# Patient Record
Sex: Male | Born: 1980
Health system: Southern US, Community
[De-identification: ages and names within clinical notes are randomized; demographics above are authoritative.]

## PROBLEM LIST (undated history)

## (undated) ENCOUNTER — Emergency Department (HOSPITAL_COMMUNITY): Payer: BC Managed Care – PPO

## (undated) HISTORY — PX: WISDOM TOOTH EXTRACTION: SHX21

## (undated) HISTORY — PX: BREAST BIOPSY: SHX20

---

## 2014-03-16 ENCOUNTER — Ambulatory Visit: Payer: Self-pay | Admitting: Family Medicine

## 2014-07-19 DIAGNOSIS — D239 Other benign neoplasm of skin, unspecified: Secondary | ICD-10-CM

## 2014-07-19 HISTORY — DX: Other benign neoplasm of skin, unspecified: D23.9

## 2014-11-14 ENCOUNTER — Encounter: Payer: Self-pay | Admitting: Internal Medicine

## 2014-11-14 ENCOUNTER — Ambulatory Visit (INDEPENDENT_AMBULATORY_CARE_PROVIDER_SITE_OTHER): Payer: BLUE CROSS/BLUE SHIELD | Admitting: Internal Medicine

## 2014-11-14 VITALS — BP 124/72 | HR 57 | Temp 98.3°F | Ht 71.5 in | Wt 185.5 lb

## 2014-11-14 DIAGNOSIS — Z0000 Encounter for general adult medical examination without abnormal findings: Secondary | ICD-10-CM | POA: Insufficient documentation

## 2014-11-14 DIAGNOSIS — R5383 Other fatigue: Secondary | ICD-10-CM

## 2014-11-14 LAB — CBC WITH DIFFERENTIAL/PLATELET
Basophils Absolute: 0 10*3/uL (ref 0.0–0.1)
Basophils Relative: 0.6 % (ref 0.0–3.0)
EOS ABS: 0.1 10*3/uL (ref 0.0–0.7)
Eosinophils Relative: 1.7 % (ref 0.0–5.0)
HCT: 41.5 % (ref 39.0–52.0)
HEMOGLOBIN: 14.3 g/dL (ref 13.0–17.0)
Lymphocytes Relative: 44.7 % (ref 12.0–46.0)
Lymphs Abs: 2.3 10*3/uL (ref 0.7–4.0)
MCHC: 34.6 g/dL (ref 30.0–36.0)
MCV: 83.3 fl (ref 78.0–100.0)
MONO ABS: 0.4 10*3/uL (ref 0.1–1.0)
Monocytes Relative: 8.1 % (ref 3.0–12.0)
NEUTROS ABS: 2.3 10*3/uL (ref 1.4–7.7)
Neutrophils Relative %: 44.9 % (ref 43.0–77.0)
Platelets: 249 10*3/uL (ref 150.0–400.0)
RBC: 4.98 Mil/uL (ref 4.22–5.81)
RDW: 12.4 % (ref 11.5–15.5)
WBC: 5.1 10*3/uL (ref 4.0–10.5)

## 2014-11-14 LAB — T4, FREE: Free T4: 0.82 ng/dL (ref 0.60–1.60)

## 2014-11-14 LAB — COMPREHENSIVE METABOLIC PANEL
ALT: 13 U/L (ref 0–53)
AST: 19 U/L (ref 0–37)
Albumin: 4.4 g/dL (ref 3.5–5.2)
Alkaline Phosphatase: 70 U/L (ref 39–117)
BUN: 18 mg/dL (ref 6–23)
CALCIUM: 9.5 mg/dL (ref 8.4–10.5)
CHLORIDE: 103 meq/L (ref 96–112)
CO2: 29 mEq/L (ref 19–32)
Creatinine, Ser: 1.04 mg/dL (ref 0.40–1.50)
GFR: 87.26 mL/min (ref >60.00)
Glucose, Bld: 84 mg/dL (ref 70–99)
Potassium: 4.3 mEq/L (ref 3.5–5.1)
Sodium: 138 mEq/L (ref 135–145)
Total Bilirubin: 0.5 mg/dL (ref 0.2–1.2)
Total Protein: 7.9 g/dL (ref 6.0–8.3)

## 2014-11-14 LAB — LIPID PANEL
Cholesterol: 244 mg/dL — ABNORMAL HIGH (ref 0–200)
HDL: 48.6 mg/dL (ref >39.00)
LDL Cholesterol: 171 mg/dL — ABNORMAL HIGH (ref 0–99)
NonHDL: 195.4
Total CHOL/HDL Ratio: 5
Triglycerides: 121 mg/dL (ref 0.0–149.0)
VLDL: 24.2 mg/dL (ref 0.0–40.0)

## 2014-11-14 NOTE — Progress Notes (Signed)
Pre visit review using our clinic review tool, if applicable. No additional management support is needed unless otherwise documented below in the visit note. 

## 2014-11-14 NOTE — Assessment & Plan Note (Signed)
Healthy No action needed reassured

## 2014-11-14 NOTE — Assessment & Plan Note (Signed)
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

## 2014-11-14 NOTE — Progress Notes (Signed)
Subjective:    Patient ID: Ricky Barajas, male    DOB: [birth year], 34 y.o.   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 Feels tired at times. Busy with kids. Has 16 acres  No current outpatient prescriptions on file prior to visit.   No current facility-administered medications on file prior to visit.    No Known Allergies  History reviewed. No pertinent past medical history.  Past Surgical History  Procedure Laterality Date  . Wisdom tooth extraction    . Wisdom tooth extraction      Family History  Problem Relation Age of Onset  . Arthritis Father   . Hyperlipidemia Father   . Heart disease Father   . Diabetes Father   . Mental illness Sister   . Cancer Maternal Grandfather     leukemia  . Hypothyroidism Mother     History   Social History  . Marital Status: Married    Spouse Name: N/A  . Number of Children: 3  . 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 Smoking status: Never Smoker   . Smokeless tobacco: Never Used  . Alcohol Use: No  . Drug Use: No  . Sexual Activity: Yes   Other Topics Concern  . Not on file   Social History Narrative  . No narrative on file   Review of Systems  Constitutional: Positive for fatigue. Negative for unexpected weight change.       Exercises regularly Wears seat belt  HENT: Negative for dental problem, hearing loss and tinnitus.        Regular with dentist  Eyes: Negative for visual disturbance.       No diplopia or unilateral vision loss  Respiratory: Negative for cough, chest tightness and shortness of breath.        Exercise induced asthma in past---better lately    Cardiovascular: Positive for leg swelling. Negative for chest pain and palpitations.       Some edema when hiking in [place] recently  Gastrointestinal: Negative for nausea, vomiting, abdominal pain, constipation and blood in stool.       No heartburn  Endocrine: Negative for polydipsia and polyuria.  Genitourinary: Negative for dysuria, urgency, frequency and difficulty urinating.  Musculoskeletal: Negative for back pain, joint swelling and arthralgias.  Skin: Negative for rash.       Some bites when in [place]--using antibiotic cream  Allergic/Immunologic: Negative for environmental allergies and immunocompromised state.  Neurological: Negative for dizziness, syncope, weakness, light-headedness, numbness and headaches.  Hematological: Negative for adenopathy. Does not bruise/bleed easily.  Psychiatric/Behavioral: Negative for sleep disturbance and dysphoric mood. The patient is not nervous/anxious.        Objective:   Physical Exam  Constitutional: He is oriented to person, place, and time. He appears well-developed and well-nourished. No distress.  HENT:  Head: Normocephalic and atraumatic.  Right Ear: External ear normal.  Left Ear: External ear normal.  Mouth/Throat: Oropharynx is clear and moist. No oropharyngeal exudate.  Eyes: Conjunctivae and EOM are normal. Pupils are equal, round, and reactive to light.  Neck: Normal range of motion. Neck supple. No thyromegaly present.  Cardiovascular: Normal rate, regular rhythm, normal heart sounds  and intact distal pulses.  Exam reveals no gallop.   No murmur heard. Pulmonary/Chest: Effort normal and breath sounds normal. No respiratory distress. He has no wheezes. He has no rales.  Abdominal: Soft. There is no tenderness.  Musculoskeletal: He exhibits no edema or tenderness.  Lymphadenopathy:    He has no cervical adenopathy.  Neurological: He is alert and oriented to person, place, and time.  Skin: No rash noted. No erythema.   Psychiatric: He has a normal mood and affect. His behavior is normal.          Assessment & Plan:

## 2014-11-15 ENCOUNTER — Encounter: Payer: Self-pay | Admitting: *Deleted

## 2015-09-18 ENCOUNTER — Ambulatory Visit (INDEPENDENT_AMBULATORY_CARE_PROVIDER_SITE_OTHER)
Admission: RE | Admit: 2015-09-18 | Discharge: 2015-09-18 | Disposition: A | Discharge status: Home / Self Care | Payer: BLUE CROSS/BLUE SHIELD | Source: Ambulatory Visit | Attending: Internal Medicine | Admitting: Internal Medicine

## 2015-09-18 ENCOUNTER — Encounter: Payer: Self-pay | Admitting: Primary Care

## 2015-09-18 ENCOUNTER — Ambulatory Visit (INDEPENDENT_AMBULATORY_CARE_PROVIDER_SITE_OTHER): Payer: BLUE CROSS/BLUE SHIELD | Admitting: Internal Medicine

## 2015-09-18 VITALS — BP 122/72 | HR 71 | Temp 98.4°F | Ht 71.5 in | Wt 184.4 lb

## 2015-09-18 DIAGNOSIS — M25571 Pain in right ankle and joints of right foot: Secondary | ICD-10-CM | POA: Diagnosis not present

## 2015-09-18 DIAGNOSIS — S93401A Sprain of unspecified ligament of right ankle, initial encounter: Secondary | ICD-10-CM

## 2015-09-18 DIAGNOSIS — S93402A Sprain of unspecified ligament of left ankle, initial encounter: Secondary | ICD-10-CM | POA: Diagnosis not present

## 2015-09-18 NOTE — Patient Instructions (Signed)

## 2015-09-18 NOTE — Progress Notes (Signed)
Pre visit review using our clinic review tool, if applicable. No additional management support is needed unless otherwise documented below in the visit note. 

## 2015-09-18 NOTE — Progress Notes (Signed)
Subjective:    Patient ID: Ricky Barajas, male    DOB: [birth year], 34 y.o.   MRN: [id number]  HPI  Pt presents to the clinic today with c/o bilateral ankle pain and swelling. He reports this started yesterday while he was playing basketball. He rolled his left ankle, but walked it off and kept playing. A little while later, he rolled his right ankle. He was not able to continue playing after that. He is able to walk but it is difficult. He has noticed more swelling and bruising of the right ankle more than the left. He has taken some Ibuprofen and put ice on it with some relief.  Review of Systems      History reviewed. No pertinent past medical history.  No current outpatient prescriptions on file.   No current facility-administered medications for this visit.    No Known Allergies  Family History  Problem Relation Age of Onset  . Arthritis Father   . Hyperlipidemia Father   . Heart disease Father   . Diabetes Father   . Mental illness Sister   . Cancer Maternal Grandfather     leukemia  . Hypothyroidism Mother     Social History   Social History  . Marital Status: Married    Spouse Name: N/A  . Number of Children: 3  . 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 Smoking status: Never Smoker   . Smokeless tobacco: Never Used  . Alcohol Use: No  . Drug Use: No  . Sexual Activity: Yes   Other Topics Concern  . Not on file   Social History Narrative     Constitutional: Denies fever, malaise, fatigue, headache or abrupt weight changes.  Musculoskeletal: Pt reports bilateral ankle pain and swelling. Denies muscle pain.  Skin: Pt reports bruising of right ankle. Denies redness, rashes, lesions or ulcercations.    No other specific complaints in a complete review of systems (except as listed in HPI above).  Objective:   Physical Exam  BP 122/72 mmHg  Pulse 71  Temp(Src)  98.4 F (36.9 C) (Oral)  Ht 5' 11.5" (1.816 m)  Wt 184 lb 6.4 oz (83.643 kg)  BMI 25.36 kg/m2  SpO2 97% Wt Readings from Last 3 Encounters:  09/18/15 184 lb 6.4 oz (83.643 kg)  11/14/14 185 lb 8 oz (84.142 kg)    General: Appears her stated age, well developed, well nourished in NAD. Skin: Warm, dry and intact. 4 cm round bruising noted of the lateral malleolus, right ankle. Heart: 2+ pedal pulses bilaterally. Cap refill < 3 secs. Musculoskeletal: Normal flexion, extension and rotation of the left ankle. Mild pain with palpation over the lateral malleolus, left ankle. Normal flexion and extension of the right ankle. Decreased rotation of the right ankle. Pain with palpation over the lateral malleolus, right ankle. Bilateral ankle swelling noted. Neurological: Alert and oriented. Sensation intact to BLE.   BMET    Component Value Date/Time   NA 138 11/14/2014 1229   K 4.3 11/14/2014 1229   CL 103 11/14/2014 1229   CO2 29 11/14/2014 1229   GLUCOSE 84 11/14/2014 1229   BUN 18 11/14/2014 1229   CREATININE 1.04 11/14/2014 1229   CALCIUM 9.5 11/14/2014 1229    Lipid Panel     Component Value Date/Time   CHOL 244* 11/14/2014 1229   TRIG 121.0 11/14/2014 1229   HDL 48.60 11/14/2014 1229  CHOLHDL 5 11/14/2014 1229   VLDL 24.2 11/14/2014 1229   LDLCALC 171* 11/14/2014 1229    CBC    Component Value Date/Time   WBC 5.1 11/14/2014 1229   RBC 4.98 11/14/2014 1229   HGB 14.3 11/14/2014 1229   HCT 41.5 11/14/2014 1229   PLT 249.0 11/14/2014 1229   MCV 83.3 11/14/2014 1229   MCHC 34.6 11/14/2014 1229   RDW 12.4 11/14/2014 1229   LYMPHSABS 2.3 11/14/2014 1229   MONOABS 0.4 11/14/2014 1229   EOSABS 0.1 11/14/2014 1229   BASOSABS 0.0 11/14/2014 1229    Hgb A1C No results found for: HGBA1C       Assessment & Plan:   Bilateral ankle pain, right > left:  Likely sprain He would like xray of right ankle- negative for fracture Right ankle wrapped with ACE  wrap Information given on [facility] He declines RX for Tramadol for pain  Return precautions given

## 2015-11-07 ENCOUNTER — Ambulatory Visit (INDEPENDENT_AMBULATORY_CARE_PROVIDER_SITE_OTHER): Payer: BLUE CROSS/BLUE SHIELD | Admitting: Family Medicine

## 2015-11-07 ENCOUNTER — Encounter: Payer: Self-pay | Admitting: Family Medicine

## 2015-11-07 VITALS — BP 108/64 | HR 64 | Temp 98.6°F | Wt 191.5 lb

## 2015-11-07 DIAGNOSIS — M79604 Pain in right leg: Secondary | ICD-10-CM | POA: Diagnosis not present

## 2015-11-07 NOTE — Patient Instructions (Signed)
Likely a hip adductor strain.  Gradual return to exercise.  Gently stretching in the meantime.  You can use an ace wrap for compression if needed.   Take care.  Glad to see you.

## 2015-11-07 NOTE — Progress Notes (Signed)
Pre visit review using our clinic review tool, if applicable. No additional management support is needed unless otherwise documented below in the visit note.  R ankle sprain in 08/2015.  Xray neg.  Slowly having sx resolve.  Still bracing with a lace up support, gradually returning to exercise in the last 2-3 weeks.    Now with medial R hamstring pain, less on the L side.  No thigh injury.  No redness.  No swelling. No bruising.  Pain comes and goes.  Noted in the last ~1 week, worse in the last few days, more with standing.  Not having pain when waking up in bed.  No h/o DVT, no known FH DVT.  No CP, not SOB.   Meds, vitals, and allergies reviewed.   ROS: See HPI.  Otherwise, noncontributory.  nad ncat rrr ctab R hamstring and quad not ttp, no bruising, no rash. Slightly ttp on medial thigh, just superior to the knee, along the distal adductors.  Calf 38.5cm B No bands, no chords.

## 2015-11-08 ENCOUNTER — Ambulatory Visit: Payer: BLUE CROSS/BLUE SHIELD | Admitting: Family Medicine

## 2015-11-08 DIAGNOSIS — M79606 Pain in leg, unspecified: Secondary | ICD-10-CM | POA: Insufficient documentation

## 2015-11-08 NOTE — Assessment & Plan Note (Signed)
Likely adductor strain, d/w pt about stretching, ACE wrap and f/u prn.  Gradual return to exercise.  No sign of ominous dx, d/w pt.

## 2016-05-11 ENCOUNTER — Ambulatory Visit (INDEPENDENT_AMBULATORY_CARE_PROVIDER_SITE_OTHER): Payer: BLUE CROSS/BLUE SHIELD | Admitting: Internal Medicine

## 2016-05-11 ENCOUNTER — Ambulatory Visit (INDEPENDENT_AMBULATORY_CARE_PROVIDER_SITE_OTHER)
Admission: RE | Admit: 2016-05-11 | Discharge: 2016-05-11 | Disposition: A | Discharge status: Home / Self Care | Payer: BLUE CROSS/BLUE SHIELD | Source: Ambulatory Visit | Attending: Internal Medicine | Admitting: Internal Medicine

## 2016-05-11 ENCOUNTER — Encounter: Payer: Self-pay | Admitting: Internal Medicine

## 2016-05-11 DIAGNOSIS — R05 Cough: Secondary | ICD-10-CM

## 2016-05-11 DIAGNOSIS — R059 Cough, unspecified: Secondary | ICD-10-CM

## 2016-05-11 MED ORDER — DOXYCYCLINE HYCLATE 100 MG PO TABS: 100.0000 mg | ORAL_TABLET | Freq: Two times a day (BID) | ORAL | 1 refills | Status: DC

## 2016-05-11 NOTE — Assessment & Plan Note (Addendum)
Clinical picture consistent with mild pertussis or atypical respiratory infection. May be on cusp of worsening and discussed the time course (can be 4-6 weeks at times) CXR unremarkable Will give Rx for doxy in case he worsens

## 2016-05-11 NOTE — Patient Instructions (Signed)
Please start the antibiotic only if you are worsening in the next few days or week (fever, worsened cough, etc)

## 2016-05-11 NOTE — Progress Notes (Signed)
   Subjective:    Patient ID: Ricky Barajas, male    DOB: 21-Nov-1980, 35 y.o.   MRN: [id number]  HPI Here due to respiratory infection  Had mostly a head cold last week Was totally exhausted also Now having a persistent hacky cough and feels it in his chest Did spend time ripping out an replacing someone's subfloor--2 days ago. Not moldy but was dusty (only wore mask part of time) Notices some wheezing Known exercise induced asthma in past--but no clear symptoms  Felt hot and cold but no clear cut fever Sweat 2 nights No SOB but has gotten winded easily (like running around with kids or running up steps) Sore throat is mostly better No headache Slight ear popping in left when blowing nose-- not really painful  Tried mucinex last week Cough drops  No current outpatient prescriptions on file prior to visit.   No current facility-administered medications on file prior to visit.     No Known Allergies  No past medical history on file.  Past Surgical History:  Procedure Laterality Date  . WISDOM TOOTH EXTRACTION    . WISDOM TOOTH EXTRACTION      Family History  Problem Relation Age of Onset  . Arthritis Father   . Hyperlipidemia Father   . Heart disease Father   . Diabetes Father   . Mental illness Sister   . Cancer Maternal Grandfather     leukemia  . Hypothyroidism Mother     Social History   Social History  . Marital status: Married    Spouse name: N/A  . Number of children: 3  . 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 Smoking status: Never Smoker  . Smokeless tobacco: Never Used  . Alcohol use No  . Drug use: No  . Sexual activity: Yes   Other Topics Concern  . Not on file   Social History Narrative  . No narrative on file   Review of Systems No rash Some nausea last week--but no vomiting Loose stools in past 2 days--but no increased frequency Appetite is  off some    Objective:   Physical Exam  Constitutional: No distress.  Coarse cough  HENT:  Mouth/Throat: Oropharynx is clear and moist. No oropharyngeal exudate.  TMs normal No sinus tenderness Mild nasal inflammation  Neck: Normal range of motion. Neck supple. No thyromegaly present.  Pulmonary/Chest: Effort normal and breath sounds normal. No respiratory distress. He has no wheezes. He has no rales.  Lymphadenopathy:    He has no cervical adenopathy.          Assessment & Plan:

## 2016-05-11 NOTE — Progress Notes (Signed)
Pre visit review using our clinic review tool, if applicable. No additional management support is needed unless otherwise documented below in the visit note. 

## 2016-12-02 ENCOUNTER — Ambulatory Visit (INDEPENDENT_AMBULATORY_CARE_PROVIDER_SITE_OTHER): Payer: BLUE CROSS/BLUE SHIELD | Admitting: Family Medicine

## 2016-12-02 ENCOUNTER — Encounter: Payer: Self-pay | Admitting: Family Medicine

## 2016-12-02 ENCOUNTER — Ambulatory Visit (INDEPENDENT_AMBULATORY_CARE_PROVIDER_SITE_OTHER)
Admission: RE | Admit: 2016-12-02 | Discharge: 2016-12-02 | Disposition: A | Discharge status: Home / Self Care | Payer: BLUE CROSS/BLUE SHIELD | Source: Ambulatory Visit | Attending: Family Medicine | Admitting: Family Medicine

## 2016-12-02 VITALS — BP 100/62 | HR 52 | Temp 98.6°F | Ht 71.5 in | Wt 196.2 lb

## 2016-12-02 DIAGNOSIS — M542 Cervicalgia: Secondary | ICD-10-CM

## 2016-12-02 DIAGNOSIS — R5382 Chronic fatigue, unspecified: Secondary | ICD-10-CM

## 2016-12-02 DIAGNOSIS — Z20828 Contact with and (suspected) exposure to other viral communicable diseases: Secondary | ICD-10-CM | POA: Diagnosis not present

## 2016-12-02 DIAGNOSIS — Z13220 Encounter for screening for lipoid disorders: Secondary | ICD-10-CM

## 2016-12-02 MED ORDER — MELOXICAM 15 MG PO TABS: 15.0000 mg | ORAL_TABLET | Freq: Every day | ORAL | 1 refills | Status: DC | PRN

## 2016-12-02 NOTE — Patient Instructions (Signed)
Neck xray today - will alert you with results  Use heat on your neck for 10 minutes at a time Change the level of keyboard whenever needed  Get a flat memory foam pillow and see it it is more comfortable Take the meloxicam 15 mg with food daily for a week and then just use if it is needed  I suspect fatigue is from schedule and sleep issues- but we are doing labs today as well   Will screen cholesterol Avoid red meat/ fried foods/ egg yolks/ fatty breakfast meats/ butter, cheese and high fat dairy/ and shellfish

## 2016-12-02 NOTE — Progress Notes (Signed)
Pre visit review using our clinic review tool, if applicable. No additional management support is needed unless otherwise documented below in the visit note. 

## 2016-12-02 NOTE — Progress Notes (Signed)
Subjective:    Patient ID: Ricky Barajas, male    DOB: 04-09-1981, 36 y.o.   MRN: [id number]  HPI Here for neck pain and fatigue and also desires cholesterol screening    Had injury playing dodge ball 1 mo ago   (he was crouched and collision with a person forcing his head to the right)  Head did not hurt /no knot or bruise  Neck hurt initially for a few days and faded  Now worse in the past several days  He also notices some crackling and crunching  It hurts in the lower area  Right now - it hurst more to extend than to flex  Rotation-hurts more to the right   No ext of pain to arms or hands but R shoulder is sore  Took some ibuprofen yesterday 2 of the 200 mg through the day and it helped some   No symptoms of a concussion at all-no headache / nausea /dizziness or concentration problems   Also quite tired  Works at [employer] professor  A lot of mono going around- he would like to be tested   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  wifes parents and his mother lives with them also - a good team  A lot of work  Paramedic  Has tried to be more aware of getting sleep  Some stressors as well   Mood is good- does not feel down or hopeless Likes his job    Generally goes to bed at 11-12 and gets up at 7-7:30  More on the weekends when he can  Not sleeping as well lately due to neck pain   Pillow - perhaps too thick /too high   No physical symptoms- no fever or rash or sore throat   No snoring  Does not wake up feeling super refreshed and not exhausted either  Does not doze while sitting   Patient Active Problem List   Diagnosis Date Noted  . Neck pain 12/02/2016  . Exposure to mononucleosis syndrome 12/02/2016  . Lipid screening 12/02/2016  . Cough 05/11/2016  . Leg pain 11/08/2015  . Preventative health care 11/14/2014  . Fatigue 11/14/2014   No past medical history on file. Past Surgical History:  Procedure Laterality Date  . WISDOM TOOTH EXTRACTION    . WISDOM  TOOTH EXTRACTION     Social History  Substance Use Topics  . Smoking status: Never Smoker  . Smokeless tobacco: Never Used  . Alcohol use No   Family History  Problem Relation Age of Onset  . Arthritis Father   . Hyperlipidemia Father   . Heart disease Father   . Diabetes Father   . Mental illness Sister   . Hypothyroidism Mother   . Cancer Maternal Grandfather     leukemia   No Known Allergies No current outpatient prescriptions on file prior to visit.   No current facility-administered medications on file prior to visit.      Review of Systems. Review of Systems  Constitutional: Negative for fever, appetite change,  and unexpected weight change. pos for fatigue  Eyes: Negative for pain and visual disturbance.  Respiratory: Negative for cough and shortness of breath.   Cardiovascular: Negative for cp or palpitations    Gastrointestinal: Negative for nausea, diarrhea and constipation.  Genitourinary: Negative for urgency and frequency.  MSK pos for neck pain  Skin: Negative for pallor or rash   Neurological: Negative for weakness, light-headedness, numbness and headaches.  Hematological:  Negative for adenopathy. Does not bruise/bleed easily.  Psychiatric/Behavioral: Negative for dysphoric mood. The patient is not nervous/anxious.         Objective:   Physical Exam  Constitutional: He appears well-developed and well-nourished. No distress.  Well appearing   HENT:  Head: Normocephalic and atraumatic.  Right Ear: External ear normal.  Left Ear: External ear normal.  Nose: Nose normal.  Mouth/Throat: Oropharynx is clear and moist.  Eyes: Conjunctivae and EOM are normal. Pupils are equal, round, and reactive to light. Right eye exhibits no discharge. Left eye exhibits no discharge. No scleral icterus.  Neck: Normal range of motion. Neck supple. No JVD present. Carotid bruit is not present. No thyromegaly present.  Cardiovascular: Normal rate, regular rhythm, normal  heart sounds and intact distal pulses.  Exam reveals no gallop.   Pulmonary/Chest: Effort normal and breath sounds normal. No respiratory distress. He has no wheezes. He exhibits no tenderness.  Abdominal: Soft. Bowel sounds are normal. He exhibits no distension, no abdominal bruit and no mass. There is no tenderness.  Musculoskeletal: He exhibits no edema or tenderness.  Tender over lower CS  Nl rom with pain on R sided tilt and rotation  Tender R sided cervical musculature incl trapezius  Nl rom shoulder , neg hawking and neer tests   Lymphadenopathy:    He has no cervical adenopathy.  Neurological: He is alert. He has normal reflexes. No cranial nerve deficit. He exhibits normal muscle tone. Coordination normal.  Skin: Skin is warm and dry. No rash noted. No erythema. No pallor.  Some acne  Some excoriated comedones on neck and back  Psychiatric: He has a normal mood and affect.          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      Relevant Orders   Mononucleosis screen (Completed)   Fatigue - Primary    Suspect due to schedule  Lab today  No s/s of depression  Mono test for recent exposure  Disc sleep and exercise habits       Relevant Orders   CBC with Differential/Platelet (Completed)   Comprehensive metabolic panel (Completed)   TSH (Completed)   Lipid screening    Lab today at pt request Diet is fair  Disc los sat/trans fat diet       Relevant Orders   Lipid panel (Completed)   Neck pain    Suspect cervical strain from prev trauma and also pillow that does not fit well  Disc use of heat and stretches Foam pillow /more flat than current  nsaid prn  Xray today -pend result       Relevant Orders   DG Cervical Spine Complete (Completed)

## 2016-12-03 LAB — LIPID PANEL
CHOL/HDL RATIO: 5
Cholesterol: 226 mg/dL — ABNORMAL HIGH (ref 0–200)
HDL: 41.3 mg/dL (ref >39.00)
LDL CALC: 146 mg/dL — AB (ref 0–99)
NONHDL: 184.35
Triglycerides: 194 mg/dL — ABNORMAL HIGH (ref 0.0–149.0)
VLDL: 38.8 mg/dL (ref 0.0–40.0)

## 2016-12-03 LAB — CBC WITH DIFFERENTIAL/PLATELET
BASOS ABS: 0.1 10*3/uL (ref 0.0–0.1)
BASOS PCT: 1 % (ref 0.0–3.0)
EOS ABS: 0.1 10*3/uL (ref 0.0–0.7)
Eosinophils Relative: 1.7 % (ref 0.0–5.0)
HCT: 40.3 % (ref 39.0–52.0)
Hemoglobin: 13.8 g/dL (ref 13.0–17.0)
Lymphocytes Relative: 31.2 % (ref 12.0–46.0)
Lymphs Abs: 2 10*3/uL (ref 0.7–4.0)
MCHC: 34.3 g/dL (ref 30.0–36.0)
MCV: 85.7 fl (ref 78.0–100.0)
MONO ABS: 0.6 10*3/uL (ref 0.1–1.0)
Monocytes Relative: 8.8 % (ref 3.0–12.0)
NEUTROS ABS: 3.6 10*3/uL (ref 1.4–7.7)
Neutrophils Relative %: 57.3 % (ref 43.0–77.0)
PLATELETS: 265 10*3/uL (ref 150.0–400.0)
RBC: 4.7 Mil/uL (ref 4.22–5.81)
RDW: 13 % (ref 11.5–15.5)
WBC: 6.3 10*3/uL (ref 4.0–10.5)

## 2016-12-03 LAB — COMPREHENSIVE METABOLIC PANEL
ALT: 12 U/L (ref 0–53)
AST: 18 U/L (ref 0–37)
Albumin: 4.4 g/dL (ref 3.5–5.2)
Alkaline Phosphatase: 66 U/L (ref 39–117)
BUN: 22 mg/dL (ref 6–23)
CHLORIDE: 104 meq/L (ref 96–112)
CO2: 29 meq/L (ref 19–32)
CREATININE: 1.08 mg/dL (ref 0.40–1.50)
Calcium: 9.4 mg/dL (ref 8.4–10.5)
GFR: 82.54 mL/min (ref >60.00)
GLUCOSE: 87 mg/dL (ref 70–99)
Potassium: 4.4 mEq/L (ref 3.5–5.1)
SODIUM: 139 meq/L (ref 135–145)
Total Bilirubin: 0.4 mg/dL (ref 0.2–1.2)
Total Protein: 7.5 g/dL (ref 6.0–8.3)

## 2016-12-03 LAB — MONONUCLEOSIS SCREEN: MONO SCREEN: NEGATIVE

## 2016-12-03 LAB — TSH: TSH: 1.9 u[IU]/mL (ref 0.35–4.50)

## 2016-12-03 NOTE — Assessment & Plan Note (Signed)
Suspect cervical strain from prev trauma and also pillow that does not fit well  Disc use of heat and stretches Foam pillow /more flat than current  nsaid prn  Xray today -pend result

## 2016-12-03 NOTE — Assessment & Plan Note (Signed)
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

## 2016-12-03 NOTE — Assessment & Plan Note (Addendum)
Suspect due to schedule  Lab today  No s/s of depression  Mono test for recent exposure  Disc sleep and exercise habits

## 2016-12-03 NOTE — Assessment & Plan Note (Signed)
Lab today at pt request Diet is fair  Disc los sat/trans fat diet

## 2017-07-02 ENCOUNTER — Encounter: Payer: Self-pay | Admitting: Family Medicine

## 2017-07-02 ENCOUNTER — Ambulatory Visit (INDEPENDENT_AMBULATORY_CARE_PROVIDER_SITE_OTHER): Payer: BLUE CROSS/BLUE SHIELD | Admitting: Family Medicine

## 2017-07-02 DIAGNOSIS — J9801 Acute bronchospasm: Secondary | ICD-10-CM | POA: Diagnosis not present

## 2017-07-02 NOTE — Assessment & Plan Note (Signed)
No sign of bacterial infection.  Pt denies allergy component.  Offered albuterol inhaler prn for coughing fits.. Pt refused.

## 2017-07-02 NOTE — Progress Notes (Signed)
   Subjective:    Patient ID: Ricky Barajas, male    DOB: 12-Oct-1980, 36 y.o.   MRN: [id number]  Cough  This is a new problem. The current episode started 1 to 4 weeks ago (3 weeks). The problem has been unchanged. The problem occurs constantly. The cough is non-productive. Associated symptoms include nasal congestion and postnasal drip. Pertinent negatives include no chest pain, chills, ear congestion, ear pain, fever, sore throat, shortness of breath or wheezing. Associated symptoms comments: Initially lost voice,  Congestion,better now, . The symptoms are aggravated by lying down. Risk factors: nonsmoker. His past medical history is significant for asthma and environmental allergies. There is no history of COPD. Has chronic fatigue,  possible exercsie induced asthma   No recent antibiotics.  Review of Systems  Constitutional: Negative for chills and fever.  HENT: Positive for postnasal drip. Negative for ear pain and sore throat.   Respiratory: Positive for cough. Negative for shortness of breath and wheezing.   Cardiovascular: Negative for chest pain.  Allergic/Immunologic: Positive for environmental allergies.   Blood pressure 92/60, pulse (!) 58, temperature 98.4 F (36.9 C), temperature source Oral, height 5' 11.5" (1.816 m), weight 191 lb 12 oz (87 kg), SpO2 98 %. BP Readings from Last 3 Encounters:  07/02/17 92/60  12/02/16 100/62  05/11/16 98/60        Objective:   Physical Exam  Constitutional: Vital signs are normal. He appears well-developed and well-nourished.  Non-toxic appearance. He does not appear ill. No distress.  HENT:  Head: Normocephalic and atraumatic.  Right Ear: Hearing, tympanic membrane, external ear and ear canal normal. No tenderness. No foreign bodies. Tympanic membrane is not retracted and not bulging.  Left Ear: Hearing, tympanic membrane, external ear and ear canal normal. No tenderness. No foreign bodies. Tympanic membrane is not retracted and  not bulging.  Nose: Nose normal. No mucosal edema or rhinorrhea. Right sinus exhibits no maxillary sinus tenderness and no frontal sinus tenderness. Left sinus exhibits no maxillary sinus tenderness and no frontal sinus tenderness.  Mouth/Throat: Uvula is midline, oropharynx is clear and moist and mucous membranes are normal. Normal dentition. No dental caries. No oropharyngeal exudate or tonsillar abscesses.  Eyes: Pupils are equal, round, and reactive to light. Conjunctivae, EOM and lids are normal. Lids are everted and swept, no foreign bodies found.  Neck: Trachea normal, normal range of motion and phonation normal. Neck supple. Carotid bruit is not present. No thyroid mass and no thyromegaly present.  Cardiovascular: Normal rate, regular rhythm, S1 normal, S2 normal, normal heart sounds, intact distal pulses and normal pulses.  Exam reveals no gallop.   No murmur heard. Pulmonary/Chest: Effort normal and breath sounds normal. No respiratory distress. He has no wheezes. He has no rhonchi. He has no rales.  Abdominal: Soft. Normal appearance and bowel sounds are normal. There is no hepatosplenomegaly. There is no tenderness. There is no rebound, no guarding and no CVA tenderness. No hernia.  Neurological: He is alert. He has normal reflexes.  Skin: Skin is warm, dry and intact. No rash noted.  Psychiatric: He has a normal mood and affect. His speech is normal and behavior is normal. Judgment normal.          Assessment & Plan:

## 2017-07-02 NOTE — Patient Instructions (Signed)
Post infectious/inflammatory bronchospasm  Symptomatic care. Call if new fever, expect several months before resolution.

## 2017-08-02 IMAGING — DX DG CERVICAL SPINE COMPLETE 4+V
5 series · 5 of 5 positions shown · non-contrast
Comparison: None in PACs

CLINICAL DATA: Neck injury 1 month ago with intermittent pain
since. Symptoms are greatest on the right.

EXAM:
CERVICAL SPINE - COMPLETE 4+ VIEW

[c-spine lat]
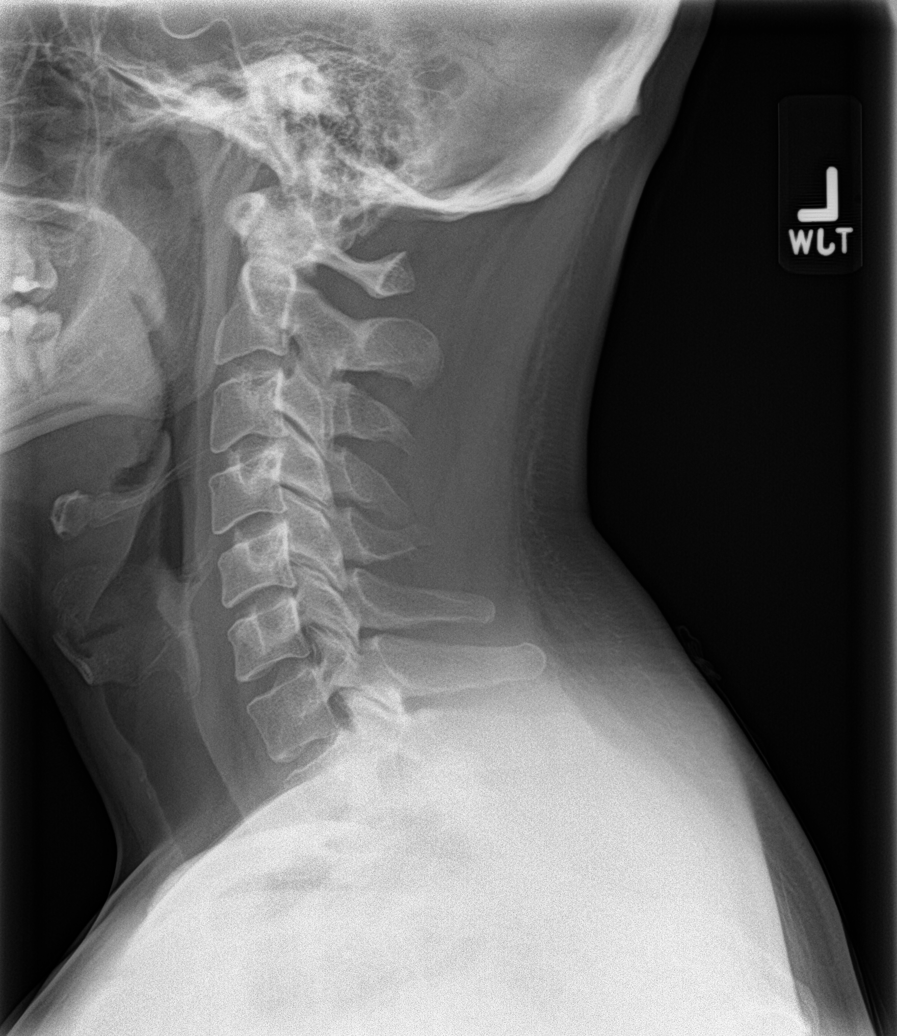

[c-spine obl (1 of 2)]
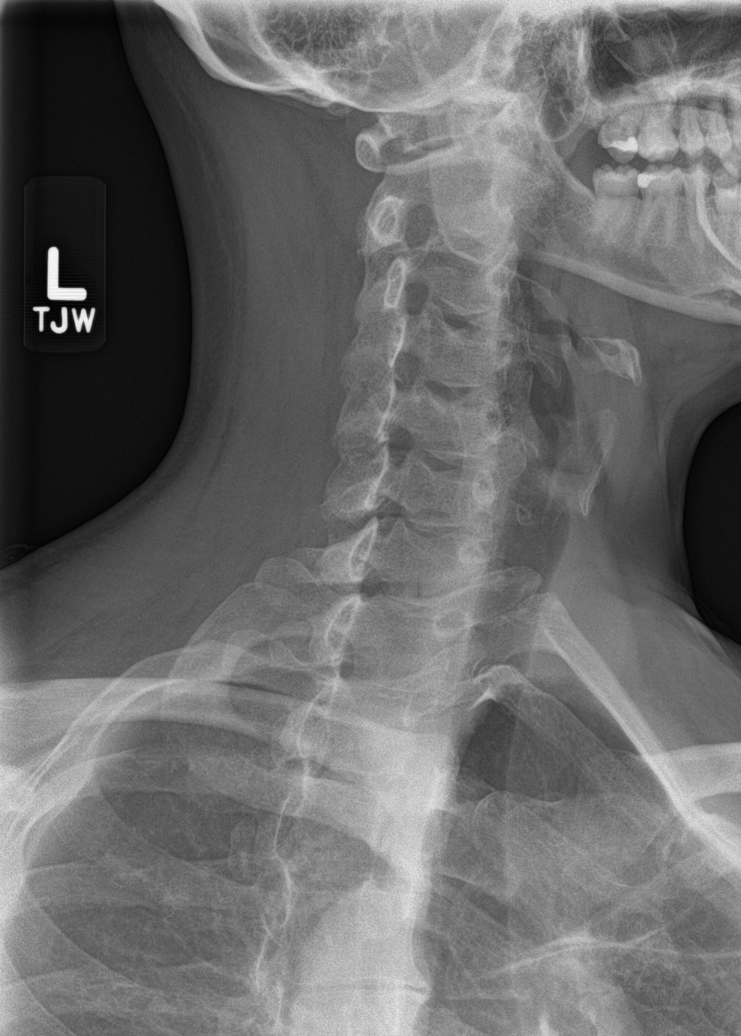

[c-spine obl (2 of 2)]
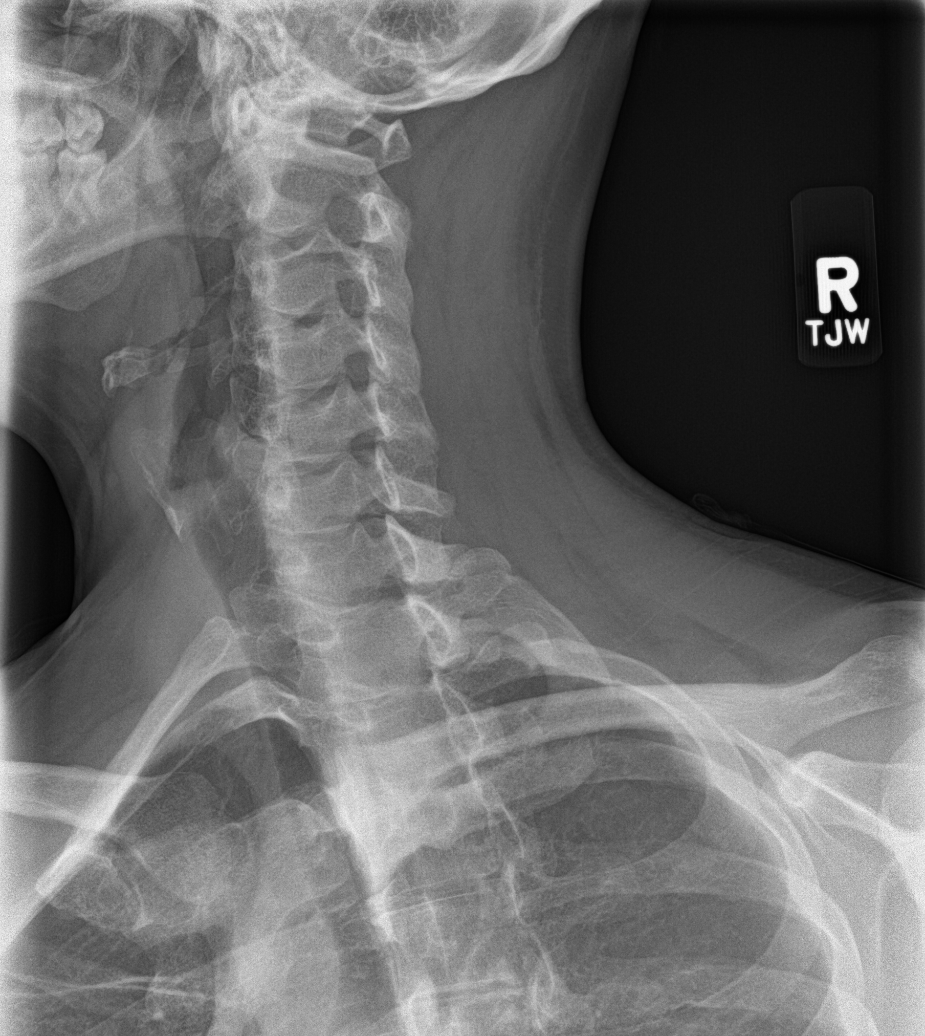

[c-spine ap]
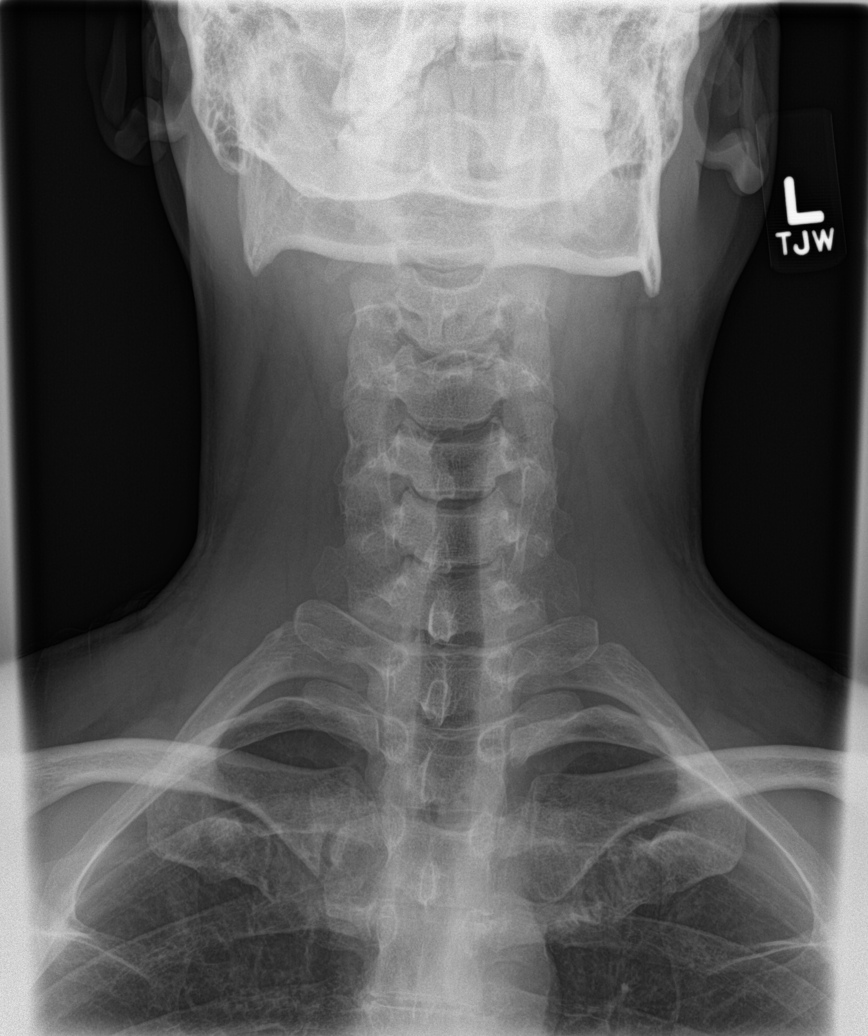

[c-spine open mouth]
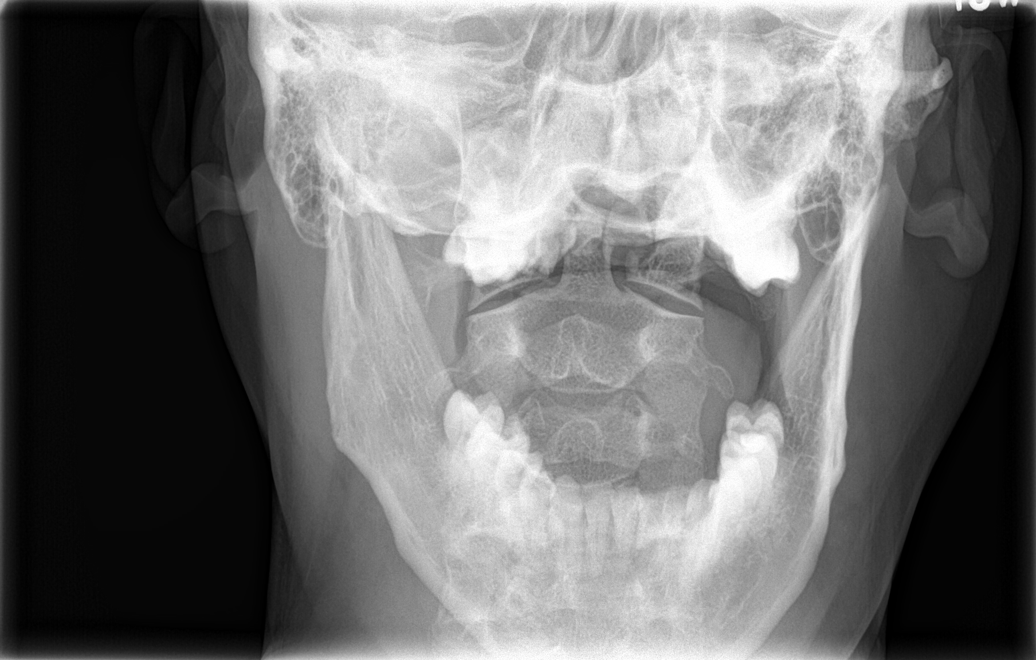

[5 of 5 positions shown; findings below may reference images not displayed]

FINDINGS: The cervical vertebral bodies are preserved in height. The disc
space heights are well maintained. The oblique views reveal no
significant bony encroachment upon the neural foramina. There is no
perched facet or spinous process fracture. The odontoid is intact.
The prevertebral soft tissue spaces are normal.
IMPRESSION: There is no acute or significant chronic bony abnormality of the
cervical spine.

## 2018-06-16 ENCOUNTER — Ambulatory Visit: Payer: BLUE CROSS/BLUE SHIELD | Admitting: Internal Medicine

## 2018-06-16 ENCOUNTER — Encounter: Payer: Self-pay | Admitting: Internal Medicine

## 2018-06-16 VITALS — BP 100/68 | HR 66 | Temp 97.8°F | Ht 72.0 in | Wt 194.0 lb

## 2018-06-16 DIAGNOSIS — R6889 Other general symptoms and signs: Secondary | ICD-10-CM

## 2018-06-16 NOTE — Progress Notes (Signed)
   Subjective:    Patient ID: Ricky Barajas, male    DOB: 1981-03-04, 37 y.o.   MRN: [id number]  HPI Here due to neck problems Did get bad "stinger" playing sports a few years ago (dodge ball) Didn't need to go to doctor Then got really stiff a couple of months ago Now still makes some "crunching noises" when he moves it around No pain  No arm pain or weakness  No current outpatient medications on file prior to visit.   No current facility-administered medications on file prior to visit.     No Known Allergies  History reviewed. No pertinent past medical history.  Past Surgical History:  Procedure Laterality Date  . WISDOM TOOTH EXTRACTION    . WISDOM TOOTH EXTRACTION      Family History  Problem Relation Age of Onset  . Arthritis Father   . Hyperlipidemia Father   . Heart disease Father   . Diabetes Father   . Mental illness Sister   . Hypothyroidism Mother   . Cancer Maternal Grandfather        leukemia    Social History   Socioeconomic History  . Marital status: Married    Spouse name: Not on file  . Number of children: 4  . Years of education: Not on file  . Highest education level: Not on file  Occupational History  . Occupation: Professor of Scientist, 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[employer]  . Financial resource strain: Not on file  . Food insecurity:    Worry: Not on file    Inability: Not on file  . Transportation needs:    Medical: Not on file    Non-medical: Not on file  Tobacco Use  . Smoking status: Never Smoker  . Smokeless tobacco: Never Used  Substance and Sexual Activity  . Alcohol use: No  . Drug use: No  . Sexual activity: Yes  Lifestyle  . Physical activity:    Days per week: Not on file    Minutes per session: Not on file  . Stress: Not on file  Relationships  . Social connections:    Talks on phone: Not on file    Gets together: Not on file    Attends religious service: Not on file    Active member of club or  organization: Not on file    Attends meetings of clubs or organizations: Not on file    Relationship status: Not on file  . Intimate partner violence:    Fear of current or ex partner: Not on file    Emotionally abused: Not on file    Physically abused: Not on file    Forced sexual activity: Not on file  Other Topics Concern  . Not on file  Social History Narrative  . Not on file   Review of Systems  Sleeps okay Stopped using pillow---feels more comfortable flat on back without pillow (but may be notice more cracking in the morning) Some troubles moving bowels--- wife has given him some OTC powder. This helps but he gets a "weight" feeling (discussed miralax)    Objective:   Physical Exam  Constitutional: He appears well-developed. No distress.  Neck: Normal range of motion. Neck supple.  No spine tenderness  Neurological:  No arm weakness           Assessment & Plan:

## 2018-06-16 NOTE — Assessment & Plan Note (Signed)
Notes noise but not really pain Some tightness at times Reassured---not bony (?ligament adjustments) No reason to limit activities If stiff--can try OTC NSAIDs

## 2019-01-16 ENCOUNTER — Other Ambulatory Visit: Payer: Self-pay

## 2019-01-16 ENCOUNTER — Ambulatory Visit (INDEPENDENT_AMBULATORY_CARE_PROVIDER_SITE_OTHER): Payer: BLUE CROSS/BLUE SHIELD | Admitting: Internal Medicine

## 2019-01-16 ENCOUNTER — Encounter: Payer: Self-pay | Admitting: Internal Medicine

## 2019-01-16 VITALS — BP 100/68 | HR 56 | Temp 98.0°F | Ht 72.0 in | Wt 193.0 lb

## 2019-01-16 DIAGNOSIS — S46811A Strain of other muscles, fascia and tendons at shoulder and upper arm level, right arm, initial encounter: Secondary | ICD-10-CM | POA: Diagnosis not present

## 2019-01-16 DIAGNOSIS — G5621 Lesion of ulnar nerve, right upper limb: Secondary | ICD-10-CM

## 2019-01-16 DIAGNOSIS — G5620 Lesion of ulnar nerve, unspecified upper limb: Secondary | ICD-10-CM | POA: Insufficient documentation

## 2019-01-16 NOTE — Progress Notes (Signed)
Subjective:    Patient ID: Ricky Barajas, male    DOB: [birth year], 38 y.o.   MRN: [id number]  HPI Here due to right shoulder pain  He may have injured it using auger ---working on barn (also doing fences, etc)--~3 weeks ago Felt it jerk---and it was sore for a day or so Has stayed active and not restricted  Now having some trouble lifting his arm--since yesterday Now really worse today As tingling down forearm to pinky finger--this worsens with keeping his elbow flexed No hand or arm weakness  No current outpatient medications on file prior to visit.   No current facility-administered medications on file prior to visit.     No Known Allergies  History reviewed. No pertinent past medical history.  Past Surgical History:  Procedure Laterality Date  . WISDOM TOOTH EXTRACTION    . WISDOM TOOTH EXTRACTION      Family History  Problem Relation Age of Onset  . Arthritis Father   . Hyperlipidemia Father   . Heart disease Father   . Diabetes Father   . Mental illness Sister   . Hypothyroidism Mother   . Cancer Maternal Grandfather        leukemia    Social History   Socioeconomic History  . Marital status: Married    Spouse name: Not on file  . Number of children: 4  . Years of education: Not on file  . Highest education level: Not on file  Occupational History  . 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 Financial resource strain: Not on file  . Food insecurity:    Worry: Not on file    Inability: Not on file  . Transportation needs:    Medical: Not on file    Non-medical: Not on file  Tobacco Use  . Smoking status: Never Smoker  . Smokeless tobacco: Never Used  Substance and Sexual Activity  . Alcohol use: No  . Drug use: No  . Sexual activity: Yes  Lifestyle  . Physical activity:    Days per week: Not on file    Minutes per session: Not on file  . Stress: Not on file  Relationships  . Social connections:     Talks on phone: Not on file    Gets together: Not on file    Attends religious service: Not on file    Active member of club or organization: Not on file    Attends meetings of clubs or organizations: Not on file    Relationship status: Not on file  . Intimate partner violence:    Fear of current or ex partner: Not on file    Emotionally abused: Not on file    Physically abused: Not on file    Forced sexual activity: Not on file  Other Topics Concern  . Not on file  Social History Narrative  . Not on file   Review of Systems No neck problems Not sick, no fevers    Objective:   Physical Exam  Neck: Normal range of motion.  No tenderness  Musculoskeletal:     Comments: No right shoulder swelling or tenderness Able to abduct completely (but has pain when he gets over the 90 degrees) Internal and external rotation are normal Some tenderness/spasm along trapezius  Neurological:  No arm weakness Worsening of ulnar distribution of numbness with pressure just distal to medial epicondyle           Assessment &  Plan:

## 2019-01-16 NOTE — Assessment & Plan Note (Signed)
Likely related to overuse from putting in fence posts Discussed rest, heat, NSAIDs Discussed--doesn't seem to be related to shoulder/trap strain or neck/CNS

## 2019-01-16 NOTE — Assessment & Plan Note (Signed)
Overuse injury Discussed rest, NSAIDs

## 2019-02-01 ENCOUNTER — Ambulatory Visit: Payer: BLUE CROSS/BLUE SHIELD | Admitting: Internal Medicine

## 2019-06-07 ENCOUNTER — Ambulatory Visit: Payer: BLUE CROSS/BLUE SHIELD | Admitting: Internal Medicine

## 2019-07-26 ENCOUNTER — Ambulatory Visit: Payer: Self-pay

## 2019-07-26 ENCOUNTER — Other Ambulatory Visit: Payer: Self-pay

## 2019-07-26 DIAGNOSIS — Z23 Encounter for immunization: Secondary | ICD-10-CM

## 2020-01-24 ENCOUNTER — Other Ambulatory Visit: Payer: Self-pay

## 2020-01-24 ENCOUNTER — Encounter: Payer: Self-pay | Admitting: Internal Medicine

## 2020-01-24 ENCOUNTER — Ambulatory Visit (INDEPENDENT_AMBULATORY_CARE_PROVIDER_SITE_OTHER): Payer: BC Managed Care – PPO | Admitting: Internal Medicine

## 2020-01-24 VITALS — BP 108/68 | HR 59 | Temp 97.3°F | Ht 72.0 in | Wt 180.0 lb

## 2020-01-24 DIAGNOSIS — E785 Hyperlipidemia, unspecified: Secondary | ICD-10-CM

## 2020-01-24 DIAGNOSIS — Z0000 Encounter for general adult medical examination without abnormal findings: Secondary | ICD-10-CM | POA: Diagnosis not present

## 2020-01-24 DIAGNOSIS — Z23 Encounter for immunization: Secondary | ICD-10-CM | POA: Diagnosis not present

## 2020-01-24 LAB — LIPID PANEL
Cholesterol: 216 mg/dL — ABNORMAL HIGH (ref 0–200)
HDL: 47.5 mg/dL (ref >39.00)
LDL Cholesterol: 155 mg/dL — ABNORMAL HIGH (ref 0–99)
NonHDL: 168.64
Total CHOL/HDL Ratio: 5
Triglycerides: 67 mg/dL (ref 0.0–149.0)
VLDL: 13.4 mg/dL (ref 0.0–40.0)

## 2020-01-24 LAB — GLUCOSE, RANDOM: Glucose, Bld: 92 mg/dL (ref 70–99)

## 2020-01-24 NOTE — Progress Notes (Signed)
Subjective:    Patient ID: Ricky Barajas, male    DOB: 1981/01/22, 39 y.o.   MRN: [id number]  HPI Here for physical This visit occurred during the SARS-CoV-2 public health emergency.  Safety protocols were in place, including screening questions prior to the visit, additional usage of staff PPE, and extensive cleaning of exam room while observing appropriate contact time as indicated for disinfecting solutions.   Wonders about his cholesterol Dad does have CAD and past MI Exercises regularly  No current outpatient medications on file prior to visit.   No current facility-administered medications on file prior to visit.    No Known Allergies  History reviewed. No pertinent past medical history.  Past Surgical History:  Procedure Laterality Date  . WISDOM TOOTH EXTRACTION    . WISDOM TOOTH EXTRACTION      Family History  Problem Relation Age of Onset  . Arthritis Father   . Hyperlipidemia Father   . Heart disease Father   . Diabetes Father   . Mental illness Sister   . Hypothyroidism Mother   . Cancer Maternal Grandfather        leukemia    Social History   Socioeconomic History  . Marital status: Married    Spouse name: Not on file  . Number of children: 4  . Years of education: Not on file  . Highest education level: Not on file  Occupational History  . 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 Smoking status: Never Smoker  . Smokeless tobacco: Never Used  Substance and Sexual Activity  . Alcohol use: No  . Drug use: No  . Sexual activity: Yes  Other Topics Concern  . Not on file  Social History Narrative  . Not on file   Social Determinants of Health   Financial Resource Strain:   . Difficulty of Paying Living Expenses:   Food Insecurity:   . Worried About Charity fundraiser in the Last Year:   . Arboriculturist in the Last Year:   Transportation Needs:   . Film/video editor (Medical):   Marland Kitchen Lack of  Transportation (Non-Medical):   Physical Activity:   . Days of Exercise per Week:   . Minutes of Exercise per Session:   Stress:   . Feeling of Stress :   Social Connections:   . Frequency of Communication with Friends and Family:   . Frequency of Social Gatherings with Friends and Family:   . Attends Religious Services:   . Active Member of Clubs or Organizations:   . Attends Archivist Meetings:   Marland Kitchen Marital Status:   Intimate Partner Violence:   . Fear of Current or Ex-Partner:   . Emotionally Abused:   Marland Kitchen Physically Abused:   . Sexually Abused:    Review of Systems  Constitutional:       Wears seat belt Not feeling fatigued lately  HENT: Negative for dental problem, hearing loss and tinnitus.        Keeps up with dentist  Eyes: Negative for visual disturbance.       No diplopia or unilateral vision loss  Respiratory: Negative for cough, chest tightness and shortness of breath.   Cardiovascular: Negative for chest pain and palpitations.  Gastrointestinal: Negative for blood in stool and constipation.       No heartburn  Endocrine: Negative for polydipsia and polyuria.  Genitourinary: Negative for difficulty urinating and urgency.  No sexual problems  Musculoskeletal: Negative for arthralgias, back pain and joint swelling.       Some right ankle swelling since sprain a while back  Skin:       occ itching on skin Dysplastic mole removed from chest last year---[facility]  Allergic/Immunologic: Negative for environmental allergies and immunocompromised state.  Neurological: Negative for dizziness, syncope, light-headedness and headaches.  Hematological: Negative for adenopathy. Does not bruise/bleed easily.  Psychiatric/Behavioral: Negative for dysphoric mood and sleep disturbance. The patient is not nervous/anxious.        Constant stress       Objective:   Physical Exam  Constitutional: He is oriented to person, place, and time. He appears well-developed.  No distress.  HENT:  Head: Normocephalic and atraumatic.  Right Ear: External ear normal.  Left Ear: External ear normal.  Mouth/Throat: Oropharynx is clear and moist. No oropharyngeal exudate.  Eyes: Pupils are equal, round, and reactive to light. Conjunctivae are normal.  Neck: No thyromegaly present.  Cardiovascular: Normal rate, regular rhythm, normal heart sounds and intact distal pulses. Exam reveals no gallop.  No murmur heard. Respiratory: Effort normal and breath sounds normal. No respiratory distress. He has no wheezes. He has no rales.  GI: Soft. There is no abdominal tenderness.  Musculoskeletal:        General: No tenderness or edema.  Lymphadenopathy:    He has no cervical adenopathy.  Neurological: He is alert and oriented to person, place, and time.  Skin: No rash noted. No erythema.  Psychiatric: He has a normal mood and affect. His behavior is normal.           Assessment & Plan:

## 2020-01-24 NOTE — Assessment & Plan Note (Signed)
Low risk No other issues Will recheck

## 2020-01-24 NOTE — Addendum Note (Signed)
Addended by: Pilar Grammes on: 01/24/2020 08:06 AM   Modules accepted: Orders

## 2020-01-24 NOTE — Assessment & Plan Note (Signed)
Healthy Good work on fitness Will update Td due to recent illness Had COVID

## 2020-02-22 ENCOUNTER — Other Ambulatory Visit: Payer: Self-pay

## 2020-02-22 ENCOUNTER — Ambulatory Visit (INDEPENDENT_AMBULATORY_CARE_PROVIDER_SITE_OTHER): Payer: BC Managed Care – PPO | Admitting: Dermatology

## 2020-02-22 DIAGNOSIS — D1801 Hemangioma of skin and subcutaneous tissue: Secondary | ICD-10-CM

## 2020-02-22 DIAGNOSIS — L814 Other melanin hyperpigmentation: Secondary | ICD-10-CM

## 2020-02-22 DIAGNOSIS — D229 Melanocytic nevi, unspecified: Secondary | ICD-10-CM

## 2020-02-22 DIAGNOSIS — Z86018 Personal history of other benign neoplasm: Secondary | ICD-10-CM

## 2020-02-22 DIAGNOSIS — L821 Other seborrheic keratosis: Secondary | ICD-10-CM | POA: Diagnosis not present

## 2020-02-22 DIAGNOSIS — D489 Neoplasm of uncertain behavior, unspecified: Secondary | ICD-10-CM

## 2020-02-22 DIAGNOSIS — D2261 Melanocytic nevi of right upper limb, including shoulder: Secondary | ICD-10-CM | POA: Diagnosis not present

## 2020-02-22 DIAGNOSIS — L578 Other skin changes due to chronic exposure to nonionizing radiation: Secondary | ICD-10-CM

## 2020-02-22 DIAGNOSIS — Z1283 Encounter for screening for malignant neoplasm of skin: Secondary | ICD-10-CM | POA: Diagnosis not present

## 2020-02-22 NOTE — Progress Notes (Signed)
Follow-Up Visit   Subjective  Ricky Barajas is a 39 y.o. male who presents for the following: TBSE (area on r ant elbow he would like looked at. Patient has a history of Dysplastic moles.) Moderate Dysplastic Moles 07/19/14 L. Lat deltoid, R Ant deltoid, L. Sup prox axillary fold, and 02/15/19 R. Groin. Severe dysplastic 02/15/19 R med pectoral with excision on 04/11/19. The patient presents for Total-Body Skin Exam (TBSE) for skin cancer screening and mole check.   The following portions of the chart were reviewed this encounter and updated as appropriate:  Tobacco  Allergies  Meds  Problems  Med Hx  Surg Hx  Fam Hx      Review of Systems:  No other skin or systemic complaints except as noted in HPI or Assessment and Plan.  Objective  Well appearing patient in no apparent distress; mood and affect are within normal limits.  A full examination was performed including scalp, head, eyes, ears, nose, lips, neck, chest, axillae, abdomen, back, buttocks, bilateral upper extremities, bilateral lower extremities, hands, feet, fingers, toes, fingernails, and toenails. All findings within normal limits unless otherwise noted below.  Objective  Right Med Pectoral, Right Groin: Scar with no evidence of recurrence.   Objective  Right Medial antecubital: 0.3 cm dark brown macule   Assessment & Plan    History of dysplastic nevus Right Med Pectoral, Right Groin   BX proven Severe Dysplastic 02/15/19, treated with excision 04/11/19 BX proven Moderate Dysplastic  02/15/19 Right Groin Clear. Observe for recurrence. Call clinic for new or changing lesions.  Recommend regular skin exams, daily broad-spectrum spf 30+ sunscreen use, and photoprotection.      Neoplasm of uncertain behavior Right Medial antecubital  Epidermal / dermal shaving  Lesion length (cm):  0.3 Lesion width (cm):  0.3 Margin per side (cm):  0.2 Total excision diameter (cm):  0.7 Informed consent: discussed  and consent obtained   Timeout: patient name, date of birth, surgical site, and procedure verified   Procedure prep:  Patient was prepped and draped in usual sterile fashion Prep type:  Isopropyl alcohol Anesthesia: the lesion was anesthetized in a standard fashion   Anesthetic:  1% lidocaine w/ epinephrine 1-100,000 buffered w/ 8.4% NaHCO3 Instrument used: flexible razor blade   Hemostasis achieved with: pressure, aluminum chloride and electrodesiccation   Outcome: patient tolerated procedure well   Post-procedure details: sterile dressing applied and wound care instructions given   Dressing type: bandage and petrolatum    Specimen 1 - Surgical pathology Differential Diagnosis: nevus vs Dysplastic nevus Check Margins: No 0.3 cm dark brown macule  Shave removal today, Specimen is very small specimen   Lentigines - Scattered tan macules - Discussed due to sun exposure - Benign, observe - Call for any changes  Seborrheic Keratoses - Stuck-on, waxy, tan-brown papules and plaques  - Discussed benign etiology and prognosis. - Observe - Call for any changes  Melanocytic Nevi - Tan-brown and/or pink-flesh-colored symmetric macules and papules - Benign appearing on exam today - Observation - Call clinic for new or changing moles - Recommend daily use of broad spectrum spf 30+ sunscreen to sun-exposed areas.   Hemangiomas - Red papules - Discussed benign nature - Observe - Call for any changes  Actinic Damage - diffuse scaly erythematous macules with underlying dyspigmentation - Recommend daily broad spectrum sunscreen SPF 30+ to sun-exposed areas, reapply every 2 hours as needed.  - Call for new or changing lesions.  Skin cancer screening performed today.  Return in about 1 year (around 02/21/2021) for TBSE.   I, Donzetta Kohut, CMA, am acting as scribe for Sarina Ser, MD .  Documentation: I have reviewed the above documentation for accuracy and completeness, and I  agree with the above.  Sarina Ser, MD

## 2020-02-22 NOTE — Patient Instructions (Addendum)

## 2020-03-02 ENCOUNTER — Encounter: Payer: Self-pay | Admitting: Dermatology

## 2020-03-05 ENCOUNTER — Telehealth: Payer: Self-pay

## 2020-03-05 NOTE — Telephone Encounter (Signed)
LM on VM to return my call. 

## 2020-03-05 NOTE — Telephone Encounter (Signed)
-----   Message from Ralene Bathe, MD sent at 03/01/2020  1:51 PM EDT ----- Skin , right medial antecubital fossa DYSPLASTIC COMPOUND NEVUS WITH MODERATE ATYPIA, IRRITATED  Dysplastic Moderate Recheck next visit (if pt does not have appt, needs one for 6-12 mos)

## 2020-03-05 NOTE — Telephone Encounter (Signed)
Patient notified of biopsy results

## 2020-04-11 ENCOUNTER — Emergency Department
Admission: EM | Admit: 2020-04-11 | Discharge: 2020-04-12 | Disposition: A | Discharge status: Home / Self Care | Payer: BC Managed Care – PPO | Attending: Emergency Medicine | Admitting: Emergency Medicine

## 2020-04-11 ENCOUNTER — Other Ambulatory Visit: Payer: Self-pay

## 2020-04-11 ENCOUNTER — Encounter: Payer: Self-pay | Admitting: Emergency Medicine

## 2020-04-11 DIAGNOSIS — Y9289 Other specified places as the place of occurrence of the external cause: Secondary | ICD-10-CM | POA: Insufficient documentation

## 2020-04-11 DIAGNOSIS — Y9301 Activity, walking, marching and hiking: Secondary | ICD-10-CM | POA: Diagnosis not present

## 2020-04-11 DIAGNOSIS — W5911XA Bitten by nonvenomous snake, initial encounter: Secondary | ICD-10-CM | POA: Insufficient documentation

## 2020-04-11 DIAGNOSIS — Y998 Other external cause status: Secondary | ICD-10-CM | POA: Diagnosis not present

## 2020-04-11 DIAGNOSIS — S91052A Open bite, left ankle, initial encounter: Secondary | ICD-10-CM | POA: Diagnosis not present

## 2020-04-11 LAB — CBC
HCT: 40.3 % (ref 39.0–52.0)
Hemoglobin: 13.9 g/dL (ref 13.0–17.0)
MCH: 29.3 pg (ref 26.0–34.0)
MCHC: 34.5 g/dL (ref 30.0–36.0)
MCV: 84.8 fL (ref 80.0–100.0)
Platelets: 260 10*3/uL (ref 150–400)
RBC: 4.75 MIL/uL (ref 4.22–5.81)
RDW: 12.4 % (ref 11.5–15.5)
WBC: 5.5 10*3/uL (ref 4.0–10.5)
nRBC: 0 % (ref 0.0–0.2)

## 2020-04-11 LAB — PROTIME-INR
INR: 0.9 (ref 0.8–1.2)
Prothrombin Time: 11.4 seconds (ref 11.4–15.2)

## 2020-04-11 LAB — BASIC METABOLIC PANEL
Anion gap: 11 (ref 5–15)
BUN: 21 mg/dL — ABNORMAL HIGH (ref 6–20)
CO2: 24 mmol/L (ref 22–32)
Calcium: 9 mg/dL (ref 8.9–10.3)
Chloride: 106 mmol/L (ref 98–111)
Creatinine, Ser: 1.24 mg/dL (ref 0.61–1.24)
GFR calc Af Amer: >60 mL/min (ref >60)
GFR calc non Af Amer: >60 mL/min (ref >60)
Glucose, Bld: 93 mg/dL (ref 70–99)
Potassium: 4.3 mmol/L (ref 3.5–5.1)
Sodium: 141 mmol/L (ref 135–145)

## 2020-04-11 LAB — APTT: aPTT: 25 seconds (ref 24–36)

## 2020-04-11 NOTE — ED Provider Notes (Signed)
-----------------------------------------   11:24 PM on 04/11/2020 -----------------------------------------  Assuming care from Dr. Archie Balboa.  In short, Ricky Barajas is a 39 y.o. male with a chief complaint of snake bite to ankle.  Refer to the original H&P for additional details.  The current plan of care is to observe.   ----------------------------------------- 3:06 AM on 04/12/2020 -----------------------------------------  Patient has been stable over the last almost 5 hours. Minimal swelling increased compared to the baseline. No pain to speak of, he said at most he would take some ibuprofen. No indication for further observation or evaluation/work-up. I had my usual customary snakebite discussion with the patient and he will follow-up as an outpatient as needed. I provided a prescription for Norco in case the pain gets worse but he anticipates not needing to use it.   Hinda Kehr, MD 04/12/20 [phone]

## 2020-04-11 NOTE — ED Notes (Signed)
L ankle measured and marked by this RN at 2242. Width- 6.5cm Length- 9cm

## 2020-04-11 NOTE — ED Provider Notes (Signed)
[facility] Emergency Department Provider Note   ____________________________________________   I have reviewed the triage vital signs and the nursing notes.   HISTORY  Chief Complaint Snake Bite   History limited by: Not Limited   HPI Ricky Barajas is a 39 y.o. male who presents to the emergency department today because of concerns for snakebite.  The patient states he was outside walking his dog when he felt a pain to his left in her ankle.  When he looked he noticed 2 small puncture marks.  This happened just prior to arrival to the emergency department.  Patient states he has 2 out of 10 pain at the site.  He has noticed some bruising around the puncture marks.  He denies any shortness of breath.  States he last had his tetanus shot within the past year.   Records reviewed. Per medical record review patient has a history of HLD  History reviewed. No pertinent past medical history.  Patient Active Problem List   Diagnosis Date Noted  . Hyperlipidemia 01/24/2020  . Preventative health care 11/14/2014    Past Surgical History:  Procedure Laterality Date  . WISDOM TOOTH EXTRACTION    . WISDOM TOOTH EXTRACTION      Prior to Admission medications   Not on File    Allergies Patient has no known allergies.  Family History  Problem Relation Age of Onset  . Arthritis Father   . Hyperlipidemia Father   . Heart disease Father   . Diabetes Father   . Mental illness Sister   . Hypothyroidism Mother   . Cancer Maternal Grandfather        leukemia    Social History Social History   Tobacco Use  . Smoking status: Never Smoker  . Smokeless tobacco: Never Used  Vaping Use  . Vaping Use: Never used  Substance Use Topics  . Alcohol use: No  . Drug use: No    Review of Systems Constitutional: No fever/chills Eyes: No visual changes. ENT: No sore throat. Cardiovascular: Denies chest pain. Respiratory: Denies shortness of  breath. Gastrointestinal: No abdominal pain.  No nausea, no vomiting.  No diarrhea.   Genitourinary: Negative for dysuria. Musculoskeletal: Negative for back pain. Skin: Positive for puncture mark to left ankle.  Neurological: Negative for headaches, focal weakness or numbness.  ____________________________________________   PHYSICAL EXAM:  VITAL SIGNS: ED Triage Vitals  Enc Vitals Group     BP 04/11/20 2230 121/83     Pulse Rate 04/11/20 2230 65     Resp 04/11/20 2230 16     Temp 04/11/20 2230 98.2 F (36.8 C)     Temp Source 04/11/20 2230 Oral     SpO2 04/11/20 2230 100 %     Weight 04/11/20 2234 180 lb (81.6 kg)     Height 04/11/20 2234 6' (1.829 m)     Head Circumference --      Peak Flow --      Pain Score 04/11/20 2231 3   Constitutional: Alert and oriented.  Eyes: Conjunctivae are normal.  ENT      Head: Normocephalic and atraumatic.      Nose: No congestion/rhinnorhea.      Mouth/Throat: Mucous membranes are moist.      Neck: No stridor. Hematological/Lymphatic/Immunilogical: No cervical lymphadenopathy. Cardiovascular: Normal rate, regular rhythm.  No murmurs, rubs, or gallops.  Respiratory: Normal respiratory effort without tachypnea nor retractions. Breath sounds are clear and equal bilaterally. No wheezes/rales/rhonchi. Gastrointestinal: Soft and non  tender. No rebound. No guarding.  Genitourinary: Deferred Musculoskeletal: Normal range of motion in all extremities. No lower extremity edema. Neurologic:  Normal speech and language. No gross focal neurologic deficits are appreciated.  Skin:  Two small punctate lesions to left inner ankle. Small amount of bruising around the wound sites. No significant swelling.  Psychiatric: Mood and affect are normal. Speech and behavior are normal. Patient exhibits appropriate insight and judgment.  ____________________________________________    LABS (pertinent positives/negatives)  CBC wbc 5.5, hgb 13.9, plt 260 BMP  wnl except bun 21 INR 0.9  ____________________________________________   EKG  None  ____________________________________________    RADIOLOGY  None  ____________________________________________   PROCEDURES  Procedures  ____________________________________________   INITIAL IMPRESSION / ASSESSMENT AND PLAN / ED COURSE  Pertinent labs & imaging results that were available during my care of the patient were reviewed by me and considered in my medical decision making (see chart for details).   Patient presented to the emergency department today because of concerns for snakebite.  Patient does have 2 punctate type lesions to his left inner ankle.  Very small amount of bruising around each lesion.  No appreciable swelling.  This point I did discussion with patient about possible antivenom.  Given the lack of swelling or significant pain I do think risk of antivenom would outweigh the benefits.  Did discuss with patient however that we would like to observe him to make sure no swelling or increased pain develop. Patient is up to date on his tetanus.   ____________________________________________   FINAL CLINICAL IMPRESSION(S) / ED DIAGNOSES  Final diagnoses:  Snake bite, initial encounter     Note: This dictation was prepared with Dragon dictation. Any transcriptional errors that result from this process are unintentional     Nance Pear, MD 04/11/20 2346

## 2020-04-11 NOTE — ED Triage Notes (Signed)
Pt in via POV, reports walking his dog tonight, getting bit by a snake to left ankle approximately 30 minutes ago, unable to determine type of snake.  Minimal swelling noted at this time.    Vitals WDL.

## 2020-04-11 NOTE — Discharge Instructions (Signed)
Please seek medical attention for any high fevers, chest pain, shortness of breath, change in behavior, persistent vomiting, bloody stool or any other new or concerning symptoms.  

## 2020-04-12 MED ORDER — HYDROCODONE-ACETAMINOPHEN 5-325 MG PO TABS: 2.0000 | ORAL_TABLET | Freq: Four times a day (QID) | ORAL | 0 refills | Status: DC | PRN

## 2020-04-16 ENCOUNTER — Telehealth: Payer: Self-pay

## 2020-04-16 NOTE — Telephone Encounter (Signed)
Spoke to pt. He states he is doing well. No issues.

## 2021-02-26 ENCOUNTER — Encounter: Payer: BC Managed Care – PPO | Admitting: Dermatology

## 2021-03-06 ENCOUNTER — Encounter: Payer: BC Managed Care – PPO | Admitting: Dermatology

## 2021-09-23 ENCOUNTER — Telehealth: Payer: Self-pay | Admitting: Internal Medicine

## 2021-09-23 NOTE — Telephone Encounter (Signed)
Patient called the office back stating that he is  frustrated because he tried to return a call and has been on hold for a long time. Patient stated that he thinks that he may have a kidney stone but does not have a history of them. Patient stated that he started about two weeks ago with side pain that goes around into his back. Patient stated that the pain is constant but is relieved with certain ways that he moves. Patient stated that he does have some pain with urination and problems with the flow of his urine. Patient stated that he would like to have an ultrasound ordered. Patient was advised that he would need to be seen for ultrasound to be ordered. Patient was advised that his urine needs to be checked and an examination done in order to know what test would be needed. Patient was advised that we do not have any appointments available today at our office. Patient was advised that we can probably get him in at [facility] tomorrow or the next day. Patient stated that he will just go to an Urgent Care in [facility] because that is where he lives. Patient was given information on the [facility]. Patient stated that he will go to their web site and schedule an appointment. Patient was advised that they also take walk-in and he verbalized understanding.

## 2021-09-23 NOTE — Telephone Encounter (Signed)
Tried calling the patient and he did not answer. LVM for patient to call back.   

## 2021-09-23 NOTE — Telephone Encounter (Signed)
Ricky Carbon, MD to Ricky Pelt, LPN  Pilar Grammes, CMA  Me      3:05 PM If he has a kidney stone, a CT is the standard test and regardless, no testing can be done at an urgent care.  It might be better to go to the ER if his symptoms are significant     Pt notified as instructed and pt said Symptoms are not bad right now and pt is going to UC to get UTI checked out. Pt has appt with [facility] in [place] on 09/24/21. ED precautions given and pt voiced understanding. Pt is going to start at UC due to cost and wait time of going to ED> pt said if more than UTI he will go to ED. Sending note to Dr Silvio Pate and for [facility] CMA on 09/24/21.

## 2021-09-23 NOTE — Telephone Encounter (Signed)
Pt called stating that he thinks he has kidney stones. Pt is asking if we do ultra sound or do have to get referred to someone else. Please advise.

## 2021-09-24 ENCOUNTER — Ambulatory Visit: Payer: Self-pay

## 2021-09-24 NOTE — Telephone Encounter (Signed)
I spoke with pt and he went to [facility] in [place]; initial urinalysis was neg but sent for 2 day culture; pt was told bad back stream. Muscle compensationg and pulling on pts pelvic area; in 2 days if pt condition worsens can go back to [facility] in [place] or go to PCP or go to ED if worsening symptoms. Pt was advised to take ibuprofen and see how pt does. Now pt said the pain is better; pt has been drinking water and cranberry juice. Pt will cb with culture results on12/29/22 or sooner if needed. Sending notte as FYI to Dr Silvio Pate and Larene Beach CMA.

## 2021-12-31 ENCOUNTER — Encounter: Payer: BC Managed Care – PPO | Admitting: Internal Medicine

## 2022-03-10 ENCOUNTER — Ambulatory Visit (INDEPENDENT_AMBULATORY_CARE_PROVIDER_SITE_OTHER): Payer: BC Managed Care – PPO | Admitting: Internal Medicine

## 2022-03-10 ENCOUNTER — Encounter: Payer: Self-pay | Admitting: Internal Medicine

## 2022-03-10 VITALS — BP 108/70 | HR 62 | Temp 97.0°F | Ht 72.0 in | Wt 192.0 lb

## 2022-03-10 DIAGNOSIS — N63 Unspecified lump in unspecified breast: Secondary | ICD-10-CM

## 2022-03-10 NOTE — Progress Notes (Signed)
   Subjective:    Patient ID: Ricky Barajas, male    DOB: 10-12-1980, 41 y.o.   MRN: [id number]  HPI Here due to a painful lump on his left chest  In grad school--he noticed small lump near left nipple Did have mammogram and biopsy that was benign That lump has remained  Now has another spot above areola that is larger and tender Seems tender when working out or if touching  No nipple discharge  No current outpatient medications on file prior to visit.   No current facility-administered medications on file prior to visit.    No Known Allergies  Past Medical History:  Diagnosis Date   Dysplastic nevus 07/19/2014   L lat deltoid - moderate, recurrent 01/02/2016   Dysplastic nevus 07/19/2014   R ant deltoid - moderate   Dysplastic nevus 07/19/2014   L sup post axillary fold - moderate, recurrent 01/02/2016   Dysplastic nevus 02/10/2018   R low back 2.5 cm lat to spine - mild    Dysplastic nevus 02/15/2019   R med pectoral - severe, excision 04/11/2019   Dysplastic nevus 02/15/2019   R groin - moderate   Dysplastic nevus 02/22/2020   R med antecubital fossa - moderate    Past Surgical History:  Procedure Laterality Date   WISDOM TOOTH EXTRACTION     WISDOM TOOTH EXTRACTION      Family History  Problem Relation Age of Onset   Arthritis Father    Hyperlipidemia Father    Heart disease Father    Diabetes Father    Mental illness Sister    Hypothyroidism Mother    Cancer Maternal Grandfather        leukemia    Social History   Socioeconomic History   Marital status: Married    Spouse name: Not on file   Number of children: 4   Years of education: Not on file   Highest education level: Not on file  Occupational History   Occupation: Professor of Scientist, 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[employer]  Tobacco Use   Smoking status: Never   Smokeless tobacco: Never  Vaping Use   Vaping Use: Never used  Substance and Sexual Activity   Alcohol use: No   Drug use: No    Sexual activity: Yes  Other Topics Concern   Not on file  Social History Narrative   Not on file   Social Determinants of Health   Financial Resource Strain: Not on file  Food Insecurity: Not on file  Transportation Needs: Not on file  Physical Activity: Not on file  Stress: Not on file  Social Connections: Not on file  Intimate Partner Violence: Not on file   Review of Systems No OTC supplements Feels fine Gained back some weight that he had lost     Objective:   Physical Exam Constitutional:      Appearance: Normal appearance.  Genitourinary:    Comments: Normal areolae on left 2 fibrous areas around the areola---lateral and superior (the new and somewhat larger one) No nipple discharge Neurological:     Mental Status: He is alert.            Assessment & Plan:

## 2022-03-10 NOTE — Assessment & Plan Note (Signed)
Feels fibrous and may be scarring from pectoral damage---but can't tell if it could be in breast tissue Very similar to area that goes back decades and had negative biopsy Will send to general surgery to decide if further action is needed

## 2022-03-16 ENCOUNTER — Encounter: Payer: Self-pay | Admitting: Surgery

## 2022-03-16 ENCOUNTER — Ambulatory Visit (INDEPENDENT_AMBULATORY_CARE_PROVIDER_SITE_OTHER): Payer: BC Managed Care – PPO | Admitting: Surgery

## 2022-03-16 VITALS — BP 115/70 | HR 56 | Temp 98.0°F | Ht 72.0 in | Wt 192.0 lb

## 2022-03-16 DIAGNOSIS — N63 Unspecified lump in unspecified breast: Secondary | ICD-10-CM

## 2022-03-16 DIAGNOSIS — N6321 Unspecified lump in the left breast, upper outer quadrant: Secondary | ICD-10-CM

## 2022-03-16 NOTE — Patient Instructions (Addendum)
We will get you scheduled for a mammogram and ultrasound. If they feel that you need a biopsy they will get you set up for this.   You are scheduled to have this done on 03/30/22 at 2:20 pm at the [facility] located in the [place], located at [address], [place], [place] [address].  We will have you follow up here after we get your results.

## 2022-03-18 ENCOUNTER — Encounter: Payer: Self-pay | Admitting: Surgery

## 2022-03-18 NOTE — Progress Notes (Signed)
Patient ID: Ricky Barajas, male   DOB: 1981/02/10, 41 y.o.   MRN: [id number]  HPI Ricky Barajas is a 41 y.o. male seen in consultation at the request of Dr. Silvio Pate. He presents with couple week history of a tender left breast associated with a fullness.  He reports that the pain is mild dull and without specific exacerbating or aggravating factors.  No family history of breast cancer or ovarian cancer.  He also denies any nipple discharge.  He does not take any medications and the only medical history has been some dysplastic nevi that he has had those removed.  He is a Human resources officer at [employer].  He is otherwise very healthy and does not take any medications.  He is able to perform more than 4 METS of activity without any shortness of breath or chest pain.  He denies any fevers and chills or any other symptoms.   He does notice that the left breast has always been larger than the right and stated that several years ago he underwent mammogram as well as a biopsy showing a benign process.  HPI  Past Medical History:  Diagnosis Date   Dysplastic nevus 07/19/2014   L lat deltoid - moderate, recurrent 01/02/2016   Dysplastic nevus 07/19/2014   R ant deltoid - moderate   Dysplastic nevus 07/19/2014   L sup post axillary fold - moderate, recurrent 01/02/2016   Dysplastic nevus 02/10/2018   R low back 2.5 cm lat to spine - mild    Dysplastic nevus 02/15/2019   R med pectoral - severe, excision 04/11/2019   Dysplastic nevus 02/15/2019   R groin - moderate   Dysplastic nevus 02/22/2020   R med antecubital fossa - moderate    Past Surgical History:  Procedure Laterality Date   WISDOM TOOTH EXTRACTION     WISDOM TOOTH EXTRACTION      Family History  Problem Relation Age of Onset   Arthritis Father    Hyperlipidemia Father    Heart disease Father    Diabetes Father    Mental illness Sister    Hypothyroidism Mother    Cancer Maternal Grandfather        leukemia     Social History Social History   Tobacco Use   Smoking status: Never    Passive exposure: Never   Smokeless tobacco: Never  Vaping Use   Vaping Use: Never used  Substance Use Topics   Alcohol use: No   Drug use: No    No Known Allergies  No current outpatient medications on file.   No current facility-administered medications for this visit.     Review of Systems Full ROS  was asked and was negative except for the information on the HPI  Physical Exam Blood pressure 115/70, pulse (!) 56, temperature 98 F (36.7 C), height 6' (1.829 m), weight 192 lb (87.1 kg), SpO2 99 %. CONSTITUTIONAL: NAD. EYES: Pupils are equal, round, and , Sclera are non-icteric. EARS, NOSE, MOUTH AND THROAT:  The oral mucosa is pink and moist. Hearing is intact to voice. LYMPH NODES:  Lymph nodes in the neck are normal. RESPIRATORY:  Lungs are clear. There is normal respiratory effort, with equal breath sounds bilaterally, and without pathologic use of accessory muscles. CARDIOVASCULAR: Heart is regular without murmurs, gallops, or rubs. BREAST: There is evidence of larger breast tissue on the left side as compared to the right.  On the left side there is a fullness around 2:00 approximately measuring  2 x 2 cm disease very ill-defined and not fixed to deep tissues.  There is no evidence of skin or nipple changes and there is no evidence of nipple discharge.  Right breast is completely normal.  There is no evidence of axillary lymphadenopathy GI: The abdomen is  soft, nontender, and nondistended. There are no palpable masses. There is no hepatosplenomegaly. There are normal bowel sounds in all quadrants. GU: Rectal deferred.   MUSCULOSKELETAL: Normal muscle strength and tone. No cyanosis or edema.   SKIN: Turgor is good and there are no pathologic skin lesions or ulcers. NEUROLOGIC: Motor and sensation is grossly normal. Cranial nerves are grossly intact. PSYCH:  Oriented to person, place and time.  Affect is normal.  Data Reviewed  I have personally reviewed the patient's imaging, laboratory findings and medical records.    Assessment/Plan  41 year old male with a tender left breast mass versus gynecomastia.  This does not seem to be of infectious etiology.  We will start diagnostic work-up with a mammogram as well as a breast ultrasound.  If indicated a biopsy will also need to be performed.  Mass is not necessarily characteristic of a neoplastic process. I had a good discussion with the patient regarding his disease process.  Currently there is no explainable gynecomastia conditions. I will see him once the work-up is completed. Please note that I spent at least 40 minutes in this encounter including personally reviewing medical records, coordinating his care, placing orders and performing appropriate mentation.  A copy of this report was sent to the referring provider   Caroleen Hamman, MD FACS General Surgeon 03/18/2022, 4:41 PM

## 2022-03-30 ENCOUNTER — Ambulatory Visit
Admission: RE | Admit: 2022-03-30 | Discharge: 2022-03-30 | Disposition: A | Discharge status: Home / Self Care | Payer: BC Managed Care – PPO | Source: Ambulatory Visit | Attending: Surgery | Admitting: Surgery

## 2022-03-30 DIAGNOSIS — N63 Unspecified lump in unspecified breast: Secondary | ICD-10-CM | POA: Diagnosis present

## 2022-04-13 ENCOUNTER — Ambulatory Visit (INDEPENDENT_AMBULATORY_CARE_PROVIDER_SITE_OTHER): Payer: BC Managed Care – PPO | Admitting: Surgery

## 2022-04-13 ENCOUNTER — Encounter: Payer: Self-pay | Admitting: Surgery

## 2022-04-13 VITALS — BP 108/67 | HR 55 | Temp 97.9°F | Wt 188.2 lb

## 2022-04-13 DIAGNOSIS — N62 Hypertrophy of breast: Secondary | ICD-10-CM | POA: Diagnosis not present

## 2022-04-13 DIAGNOSIS — N63 Unspecified lump in unspecified breast: Secondary | ICD-10-CM

## 2022-04-13 NOTE — Progress Notes (Signed)
Outpatient Surgical Follow Up  04/13/2022  Ricky Barajas is an 41 y.o. male.   Chief Complaint  Patient presents with   Follow-up    Breast mass    HPI: Ricky Barajas is a 41 year old male following for a left upper outer quadrant breast mass/fullness.  He did complete a mammogram and ultrasound that I personally reviewed showing no evidence of pathological masses.  He reports that he can feel it but is not too uncomfortable now.  No fevers or chills or drainage.  No normal skin.  Past Medical History:  Diagnosis Date   Dysplastic nevus 07/19/2014   L lat deltoid - moderate, recurrent 01/02/2016   Dysplastic nevus 07/19/2014   R ant deltoid - moderate   Dysplastic nevus 07/19/2014   L sup post axillary fold - moderate, recurrent 01/02/2016   Dysplastic nevus 02/10/2018   R low back 2.5 cm lat to spine - mild    Dysplastic nevus 02/15/2019   R med pectoral - severe, excision 04/11/2019   Dysplastic nevus 02/15/2019   R groin - moderate   Dysplastic nevus 02/22/2020   R med antecubital fossa - moderate    Past Surgical History:  Procedure Laterality Date   BREAST BIOPSY     WISDOM TOOTH EXTRACTION     WISDOM TOOTH EXTRACTION      Family History  Problem Relation Age of Onset   Arthritis Father    Hyperlipidemia Father    Heart disease Father    Diabetes Father    Mental illness Sister    Hypothyroidism Mother    Cancer Maternal Grandfather        leukemia    Social History:  reports that he has never smoked. He has never been exposed to tobacco smoke. He has never used smokeless tobacco. He reports that he does not drink alcohol and does not use drugs.  Allergies: No Known Allergies  Medications reviewed.    ROS Full ROS performed and is otherwise negative other than what is stated in HPI   BP 108/67   Pulse (!) 55   Temp 97.9 F (36.6 C) (Oral)   Wt 188 lb 3.2 oz (85.4 kg)   SpO2 98%   BMI 25.52 kg/m   Physical Exam CONSTITUTIONAL: NAD. EYES:  Pupils are equal, round, and , Sclera are non-icteric. EARS, NOSE, MOUTH AND THROAT:  The oral mucosa is pink and moist. Hearing is intact to voice. LYMPH NODES:  Lymph nodes in the neck are normal. RESPIRATORY:  Lungs are clear. There is normal respiratory effort, with equal breath sounds bilaterally, and without pathologic use of accessory muscles. CARDIOVASCULAR: Heart is regular without murmurs, gallops, or rubs. BREAST: There is evidence of larger breast tissue on the left side as compared to the right.  On the left side there is a fullness around 2:00 approximately measuring 2 x 2 cm disease very ill-defined and not fixed to deep tissues.  There is no evidence of skin or nipple changes and there is no evidence of nipple discharge.  Right breast is completely normal.  There is no evidence of axillary lymphadenopathy GI: The abdomen is  soft, nontender, and nondistended. There are no palpable masses. There is no hepatosplenomegaly. There are normal bowel sounds in all quadrants. GU: Rectal deferred.   MUSCULOSKELETAL: Normal muscle strength and tone. No cyanosis or edema.   SKIN: Turgor is good and there are no pathologic skin lesions or ulcers. NEUROLOGIC: Motor and sensation is grossly normal. Cranial nerves are grossly  intact. PSYCH:  Oriented to person, place and time. Affect is normal.    Assessment/Plan: 41 year old male with left breast tissue consistent with male gynecomastia.  At this time is causing minimal issues.  Suspicious for malignancy.  Discussed with patient in detail about his disease process.  I do not necessarily recommend excision.  We can always revisit the need for excision if this becomes more symptomatic. Please note that I spent  30 minutes in this encounter including personally reviewing medical imaging studies, coordinating his care, placing orders and performing appropriate mentation   Caroleen Hamman, MD [place] Surgeon

## 2022-04-13 NOTE — Patient Instructions (Signed)
If you have any concerns or questions, please feel free to call our office. Follow up as needed.   Gynecomastia, Adult  Gynecomastia is an overgrowth of gland tissue in a man's breasts. This may cause one or both breasts to become enlarged. The condition often develops in men who have an imbalance of the male sex hormone (testosterone) and the male sex hormone (estrogen). This means that a man may have too much estrogen, too little testosterone, or both. Gynecomastia may be a normal part of aging for some men. It can also happen to adolescent boys during puberty. What are the causes? This condition may be caused by: Certain medicines, such as: Estrogen supplements and medicines that act like estrogen in the body. Medicines that keep testosterone from functioning normally in the body (testosterone-inhibiting drugs). Anabolic steroids. Medicines to treat heartburn, cancer, heart disease, mental health problems, HIV, or AIDS. Antibiotic medicine. Chemotherapy medicine. Recreational drugs, including alcohol, marijuana, and opioids. Herbal products, including lavender and tea tree oil. A gene that is passed from parent to child (inherited). Certain medical conditions, such as: Tumors in the pituitary or adrenal gland. An overactive thyroid gland. Certain inherited disorders, including a genetic disease that causes low testosterone in males (Klinefelter syndrome). Cancer of the lung, kidney, liver, testicle, or gastrointestinal tract. Conditions that cause liver or kidney failure. Poor nutrition and starvation. Testicle shrinking or failure (testicularatrophy). In some cases, the cause may not be known. What increases the risk? The following factors may make you more likely to develop this condition: Being 61 years old or older. Being overweight. Abusing alcohol or other drugs. Having a family history of gynecomastia. What are the signs or symptoms? In most cases, breast enlargement is  the only symptom. The enlargement may start near the nipple, and the breast tissue may feel firm and rubbery. Other symptoms may include: Pain or tenderness in the breasts. Itchy breasts. How is this diagnosed? This condition may be diagnosed based on: Your symptoms and medical history. A physical exam. Imaging tests, such as: An ultrasound. A mammogram. An MRI. Blood tests. Removal of a sample of breast tissue to be tested in a lab (biopsy). How is this treated? This condition may go away on its own, without treatment. If gynecomastia is caused by a medical problem or drug abuse, treatment may include: Getting treatment for the underlying medical problem or for drug abuse. Changing or stopping medicines. Medicines to block the effects of estrogen. Taking a testosterone replacement. Surgery to remove breast tissue or any lumps in your breasts. Breast reduction surgery. This may be an option if you have severe or painful gynecomastia. Follow these instructions at home:  Take over-the-counter and prescription medicines only as told by your health care provider. Talk to your health care provider before taking any herbal medicines or diet supplements. Do not abuse drugs or alcohol. Keep all follow-up visits as told by your health care provider. This is important. Contact a health care provider if: Your breast tissue grows larger or gets more swollen or painful. You have a lump in your testicle. You have blood or discharge coming from your nipples. Your nipple changes shape. You develop a hard or painful lump in your breast. Summary Gynecomastia is an overgrowth of gland tissue in a man's breasts. This may cause one or both breasts to become enlarged. In most cases, breast enlargement is the only symptom. The enlargement may start near the nipple, and the breast tissue may feel firm and rubbery. This  condition may go away on its own, without treatment. In some cases, treatment for an  underlying medical problem or for drug abuse may be needed. Take over-the-counter and prescription medicines only as told by your health care provider. Do not abuse drugs or alcohol. This information is not intended to replace advice given to you by your health care provider. Make sure you discuss any questions you have with your health care provider. Document Revised: 03/09/2019 Document Reviewed: 03/09/2019 Elsevier Patient Education  [employer].

## 2022-04-15 ENCOUNTER — Telehealth: Payer: Self-pay

## 2022-04-15 NOTE — Telephone Encounter (Signed)
Faxed medical records to [facility] at [phone].

## 2022-04-16 ENCOUNTER — Ambulatory Visit (INDEPENDENT_AMBULATORY_CARE_PROVIDER_SITE_OTHER): Payer: BC Managed Care – PPO | Admitting: Internal Medicine

## 2022-04-16 ENCOUNTER — Encounter: Payer: Self-pay | Admitting: Internal Medicine

## 2022-04-16 VITALS — BP 104/66 | HR 48 | Temp 97.3°F | Ht 72.0 in | Wt 189.0 lb

## 2022-04-16 DIAGNOSIS — E785 Hyperlipidemia, unspecified: Secondary | ICD-10-CM | POA: Diagnosis not present

## 2022-04-16 DIAGNOSIS — Z0000 Encounter for general adult medical examination without abnormal findings: Secondary | ICD-10-CM

## 2022-04-16 NOTE — Assessment & Plan Note (Signed)
Healthy Exercising regularly Recommended updated COVID vaccine and flu vaccine in the fall No cancer screening yet

## 2022-04-16 NOTE — Assessment & Plan Note (Signed)
Mildly elevated He is not really interested in primary prevention with statin

## 2022-04-16 NOTE — Progress Notes (Signed)
Subjective:    Patient ID: Ricky Barajas, male    DOB: 04/28/1981, 41 y.o.   MRN: [id number]  HPI Here for physical  Work up for left breast benign Not bothering him now  [facility] about coronary calcium score Discussed that this is not a screening test He does exercise regularly Dad has MI in mid 50's (but also overweight, etc)  No current outpatient medications on file prior to visit.   No current facility-administered medications on file prior to visit.    No Known Allergies  Past Medical History:  Diagnosis Date   Dysplastic nevus 07/19/2014   L lat deltoid - moderate, recurrent 01/02/2016   Dysplastic nevus 07/19/2014   R ant deltoid - moderate   Dysplastic nevus 07/19/2014   L sup post axillary fold - moderate, recurrent 01/02/2016   Dysplastic nevus 02/10/2018   R low back 2.5 cm lat to spine - mild    Dysplastic nevus 02/15/2019   R med pectoral - severe, excision 04/11/2019   Dysplastic nevus 02/15/2019   R groin - moderate   Dysplastic nevus 02/22/2020   R med antecubital fossa - moderate    Past Surgical History:  Procedure Laterality Date   BREAST BIOPSY     WISDOM TOOTH EXTRACTION     WISDOM TOOTH EXTRACTION      Family History  Problem Relation Age of Onset   Arthritis Father    Hyperlipidemia Father    Heart disease Father    Diabetes Father    Mental illness Sister    Hypothyroidism Mother    Cancer Maternal Grandfather        leukemia    Social History   Socioeconomic History   Marital status: Married    Spouse name: Not on file   Number of children: 4   Years of education: Not on file   Highest education level: Not on file  Occupational History   Occupation: Professor of Scientist, 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[employer]  Tobacco Use   Smoking status: Never    Passive exposure: Never   Smokeless tobacco: Never  Vaping Use   Vaping Use: Never used  Substance and Sexual Activity   Alcohol use: No   Drug use: No   Sexual activity:  Yes  Other Topics Concern   Not on file  Social History Narrative   Not on file   Social Determinants of Health   Financial Resource Strain: Not on file  Food Insecurity: Not on file  Transportation Needs: Not on file  Physical Activity: Not on file  Stress: Not on file  Social Connections: Not on file  Intimate Partner Violence: Not on file   Review of Systems  Constitutional:  Negative for fatigue and unexpected weight change.       Wears seat belt  HENT:  Negative for dental problem, hearing loss and tinnitus.        Keeps up with dentist  Eyes:  Negative for visual disturbance.       No vision changes  Respiratory:  Negative for cough, chest tightness and shortness of breath.   Cardiovascular:  Negative for chest pain, palpitations and leg swelling.  Gastrointestinal:  Negative for blood in stool and constipation.       No heartburn  Endocrine: Negative for polydipsia and polyuria.  Genitourinary:  Negative for difficulty urinating and urgency.       No sexual problems  Musculoskeletal:  Negative for arthralgias and joint swelling.  Some low back pain (like after running a lot)--just rest   Skin:  Negative for rash.       Shows me 2-72m mole on right arm Multiple nevi removed by derm  Allergic/Immunologic: Negative for environmental allergies and immunocompromised state.  Neurological:  Negative for dizziness, syncope, light-headedness and headaches.  Hematological:  Negative for adenopathy. Does not bruise/bleed easily.  Psychiatric/Behavioral:  Negative for dysphoric mood and sleep disturbance.        Tends to anxious---nothing worrisome       Objective:   Physical Exam Constitutional:      Appearance: Normal appearance.  HENT:     Mouth/Throat:     Pharynx: No oropharyngeal exudate or posterior oropharyngeal erythema.  Eyes:     Conjunctiva/sclera: Conjunctivae normal.     Pupils: Pupils are equal, round, and reactive to light.  Cardiovascular:      Rate and Rhythm: Normal rate and regular rhythm.     Pulses: Normal pulses.     Heart sounds: No murmur heard.    No gallop.  Pulmonary:     Effort: Pulmonary effort is normal.     Breath sounds: Normal breath sounds. No wheezing or rales.  Abdominal:     Palpations: Abdomen is soft.     Tenderness: There is no abdominal tenderness.  Musculoskeletal:     Cervical back: Neck supple.     Right lower leg: No edema.     Left lower leg: No edema.  Lymphadenopathy:     Cervical: No cervical adenopathy.  Skin:    Findings: No lesion or rash.     Comments: Benign nevi  Neurological:     General: No focal deficit present.     Mental Status: He is alert and oriented to person, place, and time.  Psychiatric:        Mood and Affect: Mood normal.        Behavior: Behavior normal.            Assessment & Plan:

## 2022-04-17 LAB — CBC
HCT: 38.1 % — ABNORMAL LOW (ref 39.0–52.0)
Hemoglobin: 13.1 g/dL (ref 13.0–17.0)
MCHC: 34.4 g/dL (ref 30.0–36.0)
MCV: 86.4 fl (ref 78.0–100.0)
Platelets: 236 10*3/uL (ref 150.0–400.0)
RBC: 4.41 Mil/uL (ref 4.22–5.81)
RDW: 13 % (ref 11.5–15.5)
WBC: 4.4 10*3/uL (ref 4.0–10.5)

## 2022-04-17 LAB — LIPID PANEL
Cholesterol: 226 mg/dL — ABNORMAL HIGH (ref 0–200)
HDL: 50.9 mg/dL (ref >39.00)
LDL Cholesterol: 159 mg/dL — ABNORMAL HIGH (ref 0–99)
NonHDL: 175.25
Total CHOL/HDL Ratio: 4
Triglycerides: 80 mg/dL (ref 0.0–149.0)
VLDL: 16 mg/dL (ref 0.0–40.0)

## 2022-04-17 LAB — COMPREHENSIVE METABOLIC PANEL
ALT: 11 U/L (ref 0–53)
AST: 20 U/L (ref 0–37)
Albumin: 4.4 g/dL (ref 3.5–5.2)
Alkaline Phosphatase: 60 U/L (ref 39–117)
BUN: 14 mg/dL (ref 6–23)
CO2: 29 mEq/L (ref 19–32)
Calcium: 8.8 mg/dL (ref 8.4–10.5)
Chloride: 104 mEq/L (ref 96–112)
Creatinine, Ser: 0.92 mg/dL (ref 0.40–1.50)
GFR: 103.9 mL/min (ref >60.00)
Glucose, Bld: 71 mg/dL (ref 70–99)
Potassium: 3.7 mEq/L (ref 3.5–5.1)
Sodium: 142 mEq/L (ref 135–145)
Total Bilirubin: 0.7 mg/dL (ref 0.2–1.2)
Total Protein: 7.2 g/dL (ref 6.0–8.3)

## 2022-10-13 ENCOUNTER — Encounter: Payer: Self-pay | Admitting: Internal Medicine

## 2022-10-13 ENCOUNTER — Ambulatory Visit (INDEPENDENT_AMBULATORY_CARE_PROVIDER_SITE_OTHER): Payer: BC Managed Care – PPO | Admitting: Internal Medicine

## 2022-10-13 VITALS — BP 92/60 | HR 54 | Temp 97.6°F | Ht 72.0 in | Wt 195.0 lb

## 2022-10-13 DIAGNOSIS — R0781 Pleurodynia: Secondary | ICD-10-CM | POA: Insufficient documentation

## 2022-10-13 NOTE — Progress Notes (Signed)
Subjective:    Patient ID: Channin Agustin, male    DOB: 1981/07/21, 42 y.o.   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   No current facility-administered medications on file prior to visit.    No Known Allergies  Past Medical History:  Diagnosis Date   Dysplastic nevus 07/19/2014   L lat deltoid - moderate, recurrent 01/02/2016   Dysplastic nevus 07/19/2014   R ant deltoid - moderate   Dysplastic nevus 07/19/2014   L sup post axillary fold - moderate, recurrent 01/02/2016   Dysplastic nevus 02/10/2018   R low back 2.5 cm lat to spine - mild    Dysplastic nevus 02/15/2019   R med pectoral - severe, excision 04/11/2019   Dysplastic nevus 02/15/2019   R groin - moderate   Dysplastic nevus 02/22/2020   R med antecubital fossa - moderate    Past Surgical History:  Procedure Laterality Date   BREAST BIOPSY     WISDOM TOOTH EXTRACTION     WISDOM TOOTH EXTRACTION      Family History  Problem Relation Age of Onset   Arthritis Father    Hyperlipidemia Father    Heart disease Father    Diabetes Father    Mental illness Sister    Hypothyroidism Mother    Cancer Maternal Grandfather        leukemia    Social History   Socioeconomic History    Marital status: Married    Spouse name: Not on file   Number of children: 4   Years of education: Not on file   Highest education level: Not on file  Occupational History   Occupation: Professor of Scientist, 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[employer]   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[employer]  Tobacco Use   Smoking status: Never    Passive exposure: Never   Smokeless tobacco: Never  Vaping Use   Vaping Use: Never used  Substance and Sexual Activity   Alcohol use: No   Drug use: No   Sexual activity: Yes  Other Topics Concern   Not on file  Social History Narrative   Not on file   Social Determinants of Health   Financial Resource Strain: Not on file  Food Insecurity: Not on file  Transportation Needs: Not on file  Physical Activity: Not on file  Stress: Not on file  Social Connections: Not on file  Intimate Partner Violence: Not on file   Review of Systems Has noticed some low back pain for months---has been doing yoga with wife recently (and it helped his back) Lifts weights--minor changes in routine but nothing significant No falls    Objective:   Physical Exam Constitutional:      Appearance: Normal appearance.  Cardiovascular:  Rate and Rhythm: Normal rate and regular rhythm.     Heart sounds: No murmur heard.    No gallop.  Pulmonary:     Effort: Pulmonary effort is normal.     Breath sounds: Normal breath sounds. No wheezing or rales.     Comments: Very mild sternal tenderness Abdominal:     Palpations: Abdomen is soft.     Tenderness: There is no abdominal tenderness.  Musculoskeletal:     Right lower leg: No edema.     Left lower leg: No edema.  Neurological:     Mental Status: He is alert.            Assessment & Plan:

## 2022-10-13 NOTE — Assessment & Plan Note (Signed)
Seems clearly to be in the chest wall/sternum Might have been related to working over her head, etc Some radiation to back but not consistent with coronary ischemia/PE  Discussed CXR --will hold off Trial ibuprofen 400-600 three times a day with meals for several days or a week--if ongoing symptoms, will consider further testing

## 2023-04-21 ENCOUNTER — Ambulatory Visit (INDEPENDENT_AMBULATORY_CARE_PROVIDER_SITE_OTHER): Payer: BC Managed Care – PPO | Admitting: Internal Medicine

## 2023-04-21 ENCOUNTER — Encounter: Payer: Self-pay | Admitting: Internal Medicine

## 2023-04-21 VITALS — BP 100/60 | HR 67 | Temp 97.4°F | Ht 72.0 in | Wt 186.0 lb

## 2023-04-21 DIAGNOSIS — Z0000 Encounter for general adult medical examination without abnormal findings: Secondary | ICD-10-CM

## 2023-04-21 DIAGNOSIS — E785 Hyperlipidemia, unspecified: Secondary | ICD-10-CM

## 2023-04-21 LAB — COMPREHENSIVE METABOLIC PANEL
ALT: 16 U/L (ref 0–53)
AST: 23 U/L (ref 0–37)
Albumin: 4.5 g/dL (ref 3.5–5.2)
Alkaline Phosphatase: 61 U/L (ref 39–117)
BUN: 21 mg/dL (ref 6–23)
CO2: 28 mEq/L (ref 19–32)
Calcium: 9.2 mg/dL (ref 8.4–10.5)
Chloride: 102 mEq/L (ref 96–112)
Creatinine, Ser: 1.01 mg/dL (ref 0.40–1.50)
GFR: 92.24 mL/min (ref >60.00)
Glucose, Bld: 84 mg/dL (ref 70–99)
Potassium: 4 mEq/L (ref 3.5–5.1)
Sodium: 139 mEq/L (ref 135–145)
Total Bilirubin: 0.8 mg/dL (ref 0.2–1.2)
Total Protein: 7.7 g/dL (ref 6.0–8.3)

## 2023-04-21 LAB — LIPID PANEL
Cholesterol: 264 mg/dL — ABNORMAL HIGH (ref 0–200)
HDL: 50.8 mg/dL (ref >39.00)
LDL Cholesterol: 195 mg/dL — ABNORMAL HIGH (ref 0–99)
NonHDL: 212.78
Total CHOL/HDL Ratio: 5
Triglycerides: 89 mg/dL (ref 0.0–149.0)
VLDL: 17.8 mg/dL (ref 0.0–40.0)

## 2023-04-21 LAB — CBC
HCT: 41.9 % (ref 39.0–52.0)
Hemoglobin: 13.8 g/dL (ref 13.0–17.0)
MCHC: 32.9 g/dL (ref 30.0–36.0)
MCV: 87.9 fl (ref 78.0–100.0)
Platelets: 254 10*3/uL (ref 150.0–400.0)
RBC: 4.76 Mil/uL (ref 4.22–5.81)
RDW: 12.9 % (ref 11.5–15.5)
WBC: 4.4 10*3/uL (ref 4.0–10.5)

## 2023-04-21 NOTE — Progress Notes (Signed)
Subjective:    Patient ID: Ricky Barajas, male    DOB: 09-20-1981, 42 y.o.   MRN: [id number]  HPI Here for physical  Off from [place]--but works with [employer]--advisor and manages investments  Has a white ball on the outside of his scrotum--now a second No pain No scrotal mass or pain  Chest pain did resolve Has restarted physical work--adding on a room on his house  No current outpatient medications on file prior to visit.   No current facility-administered medications on file prior to visit.    No Known Allergies  Past Medical History:  Diagnosis Date   Dysplastic nevus 07/19/2014   L lat deltoid - moderate, recurrent 01/02/2016   Dysplastic nevus 07/19/2014   R ant deltoid - moderate   Dysplastic nevus 07/19/2014   L sup post axillary fold - moderate, recurrent 01/02/2016   Dysplastic nevus 02/10/2018   R low back 2.5 cm lat to spine - mild    Dysplastic nevus 02/15/2019   R med pectoral - severe, excision 04/11/2019   Dysplastic nevus 02/15/2019   R groin - moderate   Dysplastic nevus 02/22/2020   R med antecubital fossa - moderate    Past Surgical History:  Procedure Laterality Date   BREAST BIOPSY     WISDOM TOOTH EXTRACTION     WISDOM TOOTH EXTRACTION      Family History  Problem Relation Age of Onset   Arthritis Father    Hyperlipidemia Father    Heart disease Father    Diabetes Father    Mental illness Sister    Hypothyroidism Mother    Cancer Maternal Grandfather        leukemia    Social History   Socioeconomic History   Marital status: Married    Spouse name: Not on file   Number of children: 4   Years of education: Not on file   Highest education level: Not on file  Occupational History   Occupation: Professor of Civil Service fast streamer: [employer]   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[employer]  Tobacco Use   Smoking status: Never    Passive exposure: Never   Smokeless tobacco: Never  Vaping Use   Vaping  status: Never Used  Substance and Sexual Activity   Alcohol use: No   Drug use: No   Sexual activity: Yes  Other Topics Concern   Not on file  Social History Narrative   Not on file   Social Determinants of Health   Financial Resource Strain: Not on file  Food Insecurity: Not on file  Transportation Needs: Not on file  Physical Activity: Not on file  Stress: Not on file  Social Connections: Not on file  Intimate Partner Violence: Not on file   Review of Systems  Constitutional:  Negative for fatigue and unexpected weight change.       Still works out regularly Wears seat belt  HENT:  Negative for dental problem, hearing loss and tinnitus.        Keeps up with dentist  Eyes:  Negative for visual disturbance.       No diplopia or unilateral vision loss  Respiratory:  Negative for cough, chest tightness and shortness of breath.   Cardiovascular:  Negative for chest pain, palpitations and leg swelling.  Gastrointestinal:  Negative for blood in stool and constipation.       No heartburn  Endocrine: Negative for polydipsia and polyuria.  Genitourinary:  Negative for difficulty urinating and urgency.  No sexual problems  Musculoskeletal:  Negative for arthralgias and joint swelling.       Brief back pain--better now with doing some yoga (wife is Secondary school teacher)  Skin:  Negative for rash.  Allergic/Immunologic: Negative for environmental allergies and immunocompromised state.  Neurological:  Negative for dizziness, syncope, light-headedness and headaches.  Hematological:  Negative for adenopathy. Does not bruise/bleed easily.  Psychiatric/Behavioral:  Negative for dysphoric mood and sleep disturbance. The patient is not nervous/anxious.        Objective:   Physical Exam Constitutional:      Appearance: Normal appearance.  HENT:     Mouth/Throat:     Pharynx: No oropharyngeal exudate or posterior oropharyngeal erythema.  Eyes:     Conjunctiva/sclera: Conjunctivae normal.      Pupils: Pupils are equal, round, and reactive to light.  Cardiovascular:     Rate and Rhythm: Normal rate and regular rhythm.     Pulses: Normal pulses.     Heart sounds: No murmur heard.    No gallop.  Pulmonary:     Effort: Pulmonary effort is normal.     Breath sounds: Normal breath sounds. No wheezing or rales.  Abdominal:     Palpations: Abdomen is soft.     Tenderness: There is no abdominal tenderness.  Genitourinary:    Comments: 2 small white spots on scrotum--gland cysts (benign--reassured) Musculoskeletal:     Cervical back: Neck supple.     Right lower leg: No edema.     Left lower leg: No edema.  Lymphadenopathy:     Cervical: No cervical adenopathy.  Skin:    Findings: No lesion or rash.  Neurological:     General: No focal deficit present.     Mental Status: He is alert and oriented to person, place, and time.  Psychiatric:        Mood and Affect: Mood normal.        Behavior: Behavior normal.            Assessment & Plan:

## 2023-04-21 NOTE — Assessment & Plan Note (Signed)
Discussed primary prevention again--he wants to hold off

## 2023-04-21 NOTE — Assessment & Plan Note (Signed)
Healthy Exercises regularly No cancer screening yet Update COVID/flu vaccines in the fall

## 2024-04-24 ENCOUNTER — Encounter: Payer: BC Managed Care – PPO | Admitting: Internal Medicine
